# Patient Record
Sex: Male | Born: 1960 | Race: White | Hispanic: No | Marital: Married | Smoking: Never smoker
Health system: Southern US, Community
[De-identification: ages and names within clinical notes are randomized; demographics above are authoritative.]

## PROBLEM LIST (undated history)

## (undated) DIAGNOSIS — K219 Gastro-esophageal reflux disease without esophagitis: Secondary | ICD-10-CM

## (undated) DIAGNOSIS — C61 Malignant neoplasm of prostate: Secondary | ICD-10-CM

## (undated) DIAGNOSIS — F419 Anxiety disorder, unspecified: Secondary | ICD-10-CM

## (undated) DIAGNOSIS — J45909 Unspecified asthma, uncomplicated: Secondary | ICD-10-CM

## (undated) DIAGNOSIS — M2550 Pain in unspecified joint: Secondary | ICD-10-CM

## (undated) DIAGNOSIS — L299 Pruritus, unspecified: Secondary | ICD-10-CM

## (undated) DIAGNOSIS — R12 Heartburn: Secondary | ICD-10-CM

## (undated) DIAGNOSIS — E119 Type 2 diabetes mellitus without complications: Secondary | ICD-10-CM

## (undated) DIAGNOSIS — N138 Other obstructive and reflux uropathy: Secondary | ICD-10-CM

## (undated) DIAGNOSIS — J309 Allergic rhinitis, unspecified: Secondary | ICD-10-CM

## (undated) DIAGNOSIS — M549 Dorsalgia, unspecified: Secondary | ICD-10-CM

## (undated) DIAGNOSIS — G4730 Sleep apnea, unspecified: Secondary | ICD-10-CM

## (undated) DIAGNOSIS — N401 Enlarged prostate with lower urinary tract symptoms: Secondary | ICD-10-CM

## (undated) DIAGNOSIS — R631 Polydipsia: Secondary | ICD-10-CM

## (undated) HISTORY — PX: TOOTH EXTRACTION: SUR596

## (undated) HISTORY — PX: TONSILLECTOMY: SUR1361

---

## 2011-10-04 ENCOUNTER — Ambulatory Visit: Payer: Self-pay | Admitting: Gastroenterology

## 2011-10-07 LAB — PATHOLOGY REPORT

## 2013-12-28 DIAGNOSIS — C61 Malignant neoplasm of prostate: Secondary | ICD-10-CM | POA: Insufficient documentation

## 2013-12-28 HISTORY — DX: Malignant neoplasm of prostate: C61

## 2013-12-28 HISTORY — PX: PROSTATE BIOPSY: SHX241

## 2014-01-25 ENCOUNTER — Ambulatory Visit: Payer: Self-pay

## 2014-01-25 ENCOUNTER — Ambulatory Visit: Payer: Self-pay | Admitting: Radiation Oncology

## 2014-02-01 ENCOUNTER — Encounter: Payer: Self-pay | Admitting: Radiation Oncology

## 2014-02-01 DIAGNOSIS — N138 Other obstructive and reflux uropathy: Secondary | ICD-10-CM | POA: Insufficient documentation

## 2014-02-01 DIAGNOSIS — N401 Enlarged prostate with lower urinary tract symptoms: Secondary | ICD-10-CM

## 2014-02-01 NOTE — Progress Notes (Signed)
GU Location of Tumor / Histology: prostate  If Prostate Cancer, Gleason Score is (3 + 3) and PSA is (6.1 on 10/06/13) 07/2013 PSA 7.1 2013   PSA 3.3 2012   PSA 3.4  Patient presented 3 months ago with signs/symptoms of: urinary frequency, urgency, nocturia, weak stream, stream starts/stops, ED  Biopsies of prostate (if applicable) revealed:  Adenocarcinoma, 4/12 cores, Gleason 3+3=6, volume 33 gm  Past/Anticipated interventions by urology, if any: biopsy1/13/15  Past/Anticipated interventions by medical oncology, if any: none  Weight changes, if any: no  Bowel/Bladder complaints, if any:  Frequency voiding, regular bowel movements  Nausea/Vomiting, if any: no  Pain issues, if any:  no  SAFETY ISSUES:  Prior radiation? no  Pacemaker/ICD? no  Is the patient on methotrexate? 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

## 2014-02-02 ENCOUNTER — Ambulatory Visit
Admission: RE | Admit: 2014-02-02 | Discharge: 2014-02-02 | Disposition: A | Discharge status: Home / Self Care | Payer: BC Managed Care – PPO | Source: Ambulatory Visit | Attending: Radiation Oncology | Admitting: Radiation Oncology

## 2014-02-02 ENCOUNTER — Encounter: Payer: Self-pay | Admitting: Radiation Oncology

## 2014-02-02 VITALS — BP 126/78 | HR 60 | Temp 98.7°F | Resp 20 | Ht 69.0 in | Wt 251.9 lb

## 2014-02-02 DIAGNOSIS — C61 Malignant neoplasm of prostate: Secondary | ICD-10-CM

## 2014-02-02 DIAGNOSIS — Z79899 Other long term (current) drug therapy: Secondary | ICD-10-CM | POA: Insufficient documentation

## 2014-02-02 DIAGNOSIS — Z8042 Family history of malignant neoplasm of prostate: Secondary | ICD-10-CM | POA: Insufficient documentation

## 2014-02-02 HISTORY — DX: Polydipsia: R63.1

## 2014-02-02 HISTORY — DX: Dorsalgia, unspecified: M54.9

## 2014-02-02 HISTORY — DX: Pain in unspecified joint: M25.50

## 2014-02-02 HISTORY — DX: Benign prostatic hyperplasia with lower urinary tract symptoms: N40.1

## 2014-02-02 HISTORY — DX: Anxiety disorder, unspecified: F41.9

## 2014-02-02 HISTORY — DX: Pruritus, unspecified: L29.9

## 2014-02-02 HISTORY — DX: Malignant neoplasm of prostate: C61

## 2014-02-02 HISTORY — DX: Benign prostatic hyperplasia with lower urinary tract symptoms: N13.8

## 2014-02-02 HISTORY — DX: Heartburn: R12

## 2014-02-02 NOTE — Progress Notes (Signed)
Please see the Nurse Progress Note in the MD Initial Consult Encounter for this patient. 

## 2014-02-02 NOTE — Progress Notes (Signed)
[facility] NEW PATIENT EVALUATION  Name: Charles Buchanan MRN: [id number]  Date:   02/02/2014           DOB: 09-28-1961  Status: outpatient   CC: Dr. 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 male who is seen today through the courtesy of Dr. Risa Grill for discussion of possible radiation therapy in the management of his stage T1c favorable risk adenocarcinoma prostate. A screening PSA in 2012 was 3.4. His PSA was 3.3 and 2013, but rose to 7.1 by August of 2014. He was referred to Dr. Risa Grill who performed ultrasound-guided biopsies on 12/28/2013. His was found to have Gleason 6 (3+3) involving 10% of one core from left lateral mid gland, 5% of one core from the left mid gland, 30% of one core from left lateral apex and 60% of one core from the left apex. There was high grade PIN from the lateral mid gland. His gland volume was approximately 33 cc. He does have moderate obstructive symptomatology with an I PSS score of 18 while on tamsulosin. He does have erectile dysfunction. No significant GI difficulties.  PREVIOUS RADIATION THERAPY: No   PAST MEDICAL HISTORY:  has a past medical history of Anxiety; Heartburn; Pruritus; Polydipsia; Back pain; Joint pain; BPH with urinary obstruction; and Prostate cancer (12/28/13).     PAST SURGICAL HISTORY:  Past Surgical History  Procedure Laterality Date  . Prostate biopsy  12/28/13    Gleason 6, vol 33 gm  . Tooth extraction       FAMILY HISTORY: family history includes Cancer in his father, maternal grandfather, and paternal grandfather; Diabetes in his father and paternal grandfather. His grandfather was diagnosed with prostate cancer in his 5s and apparently underwent surgery. His father was diagnosed with prostate cancer at age 50 and underwent  surgery.   SOCIAL HISTORY:  reports that he has never smoked. He does not have any smokeless tobacco history on file. He reports that he does not drink alcohol or use illicit drugs. Married, 3 children. He works as a Animal nutritionist at an Journalist, newspaper.   ALLERGIES: Review of patient's allergies indicates no known allergies.   MEDICATIONS:  Current Outpatient Prescriptions  Medication Sig Dispense Refill  . Cetirizine HCl 10 MG CAPS Take 10 mg by mouth daily.       Marland Kitchen ibuprofen (ADVIL,MOTRIN) 200 MG tablet Take 400 mg by mouth every 6 (six) hours as needed.      Marland Kitchen omeprazole (PRILOSEC) 20 MG capsule Take 20 mg by mouth daily.      . tamsulosin (FLOMAX) 0.4 MG CAPS capsule Take 0.4 mg by mouth daily.       No current facility-administered medications for this encounter.     REVIEW OF SYSTEMS:  Pertinent items are noted in HPI.    PHYSICAL EXAM:  height is 5\' 9"  (1.753 m) and weight is 251 lb 14.4 oz (114.261 kg). His oral temperature is 98.7 F (37.1 C). His blood pressure is 126/78 and his pulse is 60. His respiration is 20.   Alert and oriented. Head and neck examination: Grossly unremarkable. Nodes: Without palpable cervical or supraclavicular adenopathy. Chest: Lungs clear. Back: Without spinal or CVA tenderness. Heart: Regular rate rhythm. Abdomen: Without masses organomegaly. Genitalia: Unremarkable to inspection. Rectal: Prostate gland is  normal size and is without focal induration or nodularity. Extremities: There is an erythematous lesion measuring approximately 2 cm along his medial right thigh apparently starting out as folliculitis according to the patient.   LABORATORY DATA:  No results found for this basename: WBC, HGB, HCT, MCV, PLT   No results found for this basename: NA, K, CL, CO2   No results found for this basename: ALT, AST, GGT, ALKPHOS, BILITOT   PSA 7.1 from August 2014 and 6.1 from 10/06/2013   IMPRESSION: Stage T1c  favorable risk adenocarcinoma  prostate. I explained to the patient and his wife that his prognosis is related to his stage, PSA level, and Gleason score. All are favorable. We discussed surgery versus surveillance/observation, and radiation therapy. Considering his young age he is not a good candidate for surveillance/observation. Radiation therapy options include seed implantation alone or 8 weeks of external beam/IMRT. He is not an ideal candidate for seed implantation based on his urinary obstructive symptomatology. My preference is to have an I PSS score of 15 or less for seed implantation.. His preferred radiation therapy option would be external beam/IMRT. Considering his young age and degree of obstructive symptomatology,  I favor surgery with Dr. Risa Grill. The patient is interested in pursuing robotic surgery with Dr. Risa Grill.   PLAN: As discussed above.  I spent 60 minutes minutes face to face with the patient and more than 50% of that time was spent in counseling and/or coordination of care.

## 2014-02-03 ENCOUNTER — Encounter: Payer: Self-pay | Admitting: *Deleted

## 2014-02-03 NOTE — Progress Notes (Signed)
[facility] Psychosocial Distress Screening Clinical Social Work  Clinical Social Work was referred by distress screening protocol.  The patient scored a 5 on the Psychosocial Distress Thermometer which indicates moderate distress. Clinical Social Worker phoned Pt and left vm on his cell as directed on Distress Screen in attempt to assess for distress and other psychosocial needs.  He checked his main stressor is his prostate cancer.  Clinical Social Worker follow up needed: yes  If yes, follow up plan: CSW awaits return call from pt.  Loren Racer, LCSW Clinical Social Worker [facility] Wednesday, Thursday and Friday Phone: [phone] Fax: [phone]

## 2014-02-07 ENCOUNTER — Other Ambulatory Visit: Payer: Self-pay | Admitting: Urology

## 2014-02-08 ENCOUNTER — Other Ambulatory Visit: Payer: Self-pay | Admitting: Urology

## 2014-02-17 ENCOUNTER — Other Ambulatory Visit (HOSPITAL_COMMUNITY): Payer: Self-pay | Admitting: Urology

## 2014-02-17 ENCOUNTER — Encounter (HOSPITAL_COMMUNITY): Payer: Self-pay | Admitting: Pharmacy Technician

## 2014-02-18 ENCOUNTER — Encounter (HOSPITAL_COMMUNITY)
Admission: RE | Admit: 2014-02-18 | Discharge: 2014-02-18 | Disposition: A | Discharge status: Home / Self Care | Payer: BC Managed Care – PPO | Source: Ambulatory Visit | Attending: Urology | Admitting: Urology

## 2014-02-18 ENCOUNTER — Encounter (HOSPITAL_COMMUNITY): Payer: Self-pay

## 2014-02-18 DIAGNOSIS — Z01812 Encounter for preprocedural laboratory examination: Secondary | ICD-10-CM | POA: Insufficient documentation

## 2014-02-18 HISTORY — DX: Gastro-esophageal reflux disease without esophagitis: K21.9

## 2014-02-18 LAB — BASIC METABOLIC PANEL
BUN: 10 mg/dL (ref 6–23)
CO2: 23 mEq/L (ref 19–32)
Calcium: 9 mg/dL (ref 8.4–10.5)
Chloride: 102 mEq/L (ref 96–112)
Creatinine, Ser: 0.82 mg/dL (ref 0.50–1.35)
GFR calc Af Amer: >90 mL/min (ref >90)
GFR calc non Af Amer: >90 mL/min (ref >90)
GLUCOSE: 107 mg/dL — AB (ref 70–99)
POTASSIUM: 4 meq/L (ref 3.7–5.3)
Sodium: 137 mEq/L (ref 137–147)

## 2014-02-18 LAB — CBC
HCT: 40.3 % (ref 39.0–52.0)
Hemoglobin: 13.2 g/dL (ref 13.0–17.0)
MCH: 26.1 pg (ref 26.0–34.0)
MCHC: 32.8 g/dL (ref 30.0–36.0)
MCV: 79.8 fL (ref 78.0–100.0)
Platelets: 265 10*3/uL (ref 150–400)
RBC: 5.05 MIL/uL (ref 4.22–5.81)
RDW: 14.9 % (ref 11.5–15.5)
WBC: 6.9 10*3/uL (ref 4.0–10.5)

## 2014-02-18 LAB — ABO/RH: ABO/RH(D): B POS

## 2014-02-18 NOTE — Patient Instructions (Addendum)
      Your procedure is scheduled on:  02/23/14  [facility]  Report to [place] at  [place]     AM.  Call this number if you have problems the morning of surgery: [phone]      BOWEL PREP AS PER OFFICE  Do not eat food After Midnight. Monday NIGHT. BEGIN CLEAR LIQUIDS TUESDAY-- DRINK FLUIDS UNTIL MIDNIGHT OR BEDTIME Tuesday NIGHT-- THEN NOTHING BY MOUTH   Take these medicines the morning of surgery with A SIP OF WATER: Ceritizine, Prolisec   .  Contacts, dentures or partial plates, or metal hairpins  can not be worn to surgery. Your family will be responsible for glasses, dentures, hearing aides while you are in surgery  Leave suitcase in the car. After surgery it may be brought to your room.  For patients admitted to the hospital, checkout time is 11:00 AM day of  discharge.                DO NOT WEAR JEWELRY, LOTIONS, POWDERS, OR PERFUMES.  WOMEN-- DO NOT SHAVE LEGS OR UNDERARMS FOR 48 HOURS BEFORE SHOWERS. MEN MAY SHAVE FACE.  Patients discharged the day of surgery will not be allowed to drive home. IF going home the day of surgery, you must have a driver and someone to stay with you for the first 24 hours  Name and phone number of your driver:  admission                                                                      Please read over the following fact sheets that you were given:  Blood Transfusion Sheet  Information                     [place]                                                  Patient Signature _____________________________

## 2014-02-22 NOTE — Anesthesia Preprocedure Evaluation (Addendum)
Anesthesia Evaluation  Patient identified by MRN, date of birth, ID band Patient awake    Reviewed: Allergy & Precautions, H&P , NPO status , Patient's Chart, lab work & pertinent test results  Airway Mallampati: III TM Distance: >3 FB Neck ROM: Full    Dental  (+) Teeth Intact, Dental Advisory Given   Pulmonary neg pulmonary ROS,  breath sounds clear to auscultation  Pulmonary exam normal       Cardiovascular negative cardio ROS  Rhythm:Regular Rate:Normal     Neuro/Psych Anxiety negative neurological ROS  negative psych ROS   GI/Hepatic Neg liver ROS, GERD-  Medicated,  Endo/Other  negative endocrine ROSMorbid obesity  Renal/GU negative Renal ROS  negative genitourinary   Musculoskeletal negative musculoskeletal ROS (+)   Abdominal   Peds  Hematology negative hematology ROS (+)   Anesthesia Other Findings   Reproductive/Obstetrics                          Anesthesia Physical Anesthesia Plan  ASA: II  Anesthesia Plan: General   Post-op Pain Management:    Induction: Intravenous  Airway Management Planned: Oral ETT  Additional Equipment:   Intra-op Plan:   Post-operative Plan: Extubation in OR  Informed Consent: I have reviewed the patients History and Physical, chart, labs and discussed the procedure including the risks, benefits and alternatives for the proposed anesthesia with the patient or authorized representative who has indicated his/her understanding and acceptance.   Dental advisory given  Plan Discussed with: CRNA  Anesthesia Plan Comments:         Anesthesia Quick Evaluation

## 2014-02-22 NOTE — H&P (Signed)
Reason For Visit   Here for RLRRP for T1 C low risk prostate cancer. History of Present Illness    Charles Buchanan presents status post prostate ultrasound and biopsy ( 1/ 2015). He was noted to have an elevated PSA in 6-7 range and had a strong family history of prostate cancer. Digital rectal exam was unremarkable. Ultrasound revealed a 33 g prostate without any worrisome features. All right-sided biopsies were negative with the exception of one biopsy which demonstrated high grade PIN. On the left 4 of 6 biopsies positive. Tumor appeared to be primarily at the left apex. All biopsies showed Gleason 3+3 = 6 cancer with 5-60% core involvement. The patient is best classified as having low risk/favorable clinical stage TIc disease.   He has had no problems with biopsy. No prior intra-abdominal surgery.     ipss: 16/3         SHIM: 17/25   Past Medical History Problems  1. History of Anxiety (300.00) 2. History of heartburn (V12.79)  Surgical History Problems  1. History of Oral Surgery Tooth Extraction  Current Meds 1. Cetirizine HCl - 10 MG Oral Tablet;  Therapy: (Recorded:11Nov2014) to Recorded 2. Omeprazole 20 MG Oral Capsule Delayed Release;  Therapy: (Recorded:11Nov2014) to Recorded 3. Tamsulosin HCl - 0.4 MG Oral Capsule; TAKE 1 CAPSULE EVERY DAY;  Therapy: [id number] to (Last Rx:11Nov2014) Ordered  Allergies Medication  1. No Known Drug Allergies  Family History Problems  1. Family history of diabetes mellitus (V18.0) : Father, Paternal Grandfather 2. Family history of prostate cancer (K59.93) : Father, Paternal Grandfather 3. FHx: throat cancer (V16.0) : Maternal Grandfather  Social History Problems  1. Denied: History of Alcohol use 2. Caffeine use (V49.89)   1 qd 3. Father's age   31yrs 4. Married 5. Mother's age   71yrs 6. Never smoker (V49.89) 7. Occupation   Corporate treasurer 8. Three children  Review of Systems Genitourinary, constitutional, skin, eye,  otolaryngeal, hematologic/lymphatic, cardiovascular, pulmonary, endocrine, musculoskeletal, gastrointestinal, neurological and psychiatric system(s) were reviewed and pertinent findings if present are noted.  Genitourinary: urinary frequency, feelings of urinary urgency, nocturia, weak urinary stream, urinary stream starts and stops and erectile dysfunction.  Constitutional: feeling tired (fatigue).  Integumentary: skin rash/lesion and pruritus.  ENT: sinus problems.  Endocrine: polydipsia.  Musculoskeletal: back pain and joint pain.  Psychiatric: anxiety.    Vitals Vital Signs [Data Includes: Last 1 Day]   Blood Pressure: 130 / 82 Temperature: 98.5 F Heart Rate: 74   Physical Exam Constitutional: Well nourished and well developed . No acute distress.  ENT:. The ears and nose are normal in appearance.  Neck: The appearance of the neck is normal and no neck mass is present.  Pulmonary: No respiratory distress and normal respiratory rhythm and effort.  Cardiovascular: Heart rate and rhythm are normal . No peripheral edema.  Abdomen: The abdomen is soft and nontender. No masses are palpated. No CVA tenderness. No hernias are palpable. No hepatosplenomegaly noted.  Rectal: Rectal exam demonstrates normal sphincter tone, no tenderness and no masses. Estimated prostate size is 1+. Normal rectal tone, no rectal masses, prostate is smooth, symmetric and non-tender. The prostate has no nodularity and is not tender. The left seminal vesicle is nonpalpable. The right seminal vesicle is nonpalpable. The perineum is normal on inspection.  Genitourinary: Examination of the penis demonstrates no discharge, no masses, no lesions and a normal meatus. The penis is uncircumcised. The scrotum is without lesions. The right epididymis is palpably normal and non-tender. The  left epididymis is palpably normal and non-tender. The right testis is non-tender and without masses. The left testis is non-tender and without  masses.  Lymphatics: The femoral and inguinal nodes are not enlarged or tender.  Skin: Normal skin turgor, no visible rash and no visible skin lesions.  Neuro/Psych:. Mood and affect are appropriate.   Assessment Assessed  1. Prostate cancer (185)  Plan Prostate cancer  1. Radiation Oncology Referral Referral  Referral dr. Valere Dross possible seeds  Status: Hold  For - Appointment,Records  Requested for: 02Feb2015  Discussion/Summary  The patient was counseled about the natural history of prostate cancer and the standard treatment options that are available for prostate cancer. It was explained to him how his age and life expectancy, clinical stage, Gleason score, and PSA affect his prognosis, the decision to proceed with additional staging studies, as well as how that information influences recommended treatment strategies. We discussed the roles for active surveillance, radiation therapy, surgical therapy, androgen deprivation, as well as ablative therapy options for the treatment of prostate cancer as appropriate to his individual cancer situation. We discussed the risks and benefits of these options with regard to their impact on cancer control and also in terms of potential adverse events, complications, and impact on quiality of life particularly related to urinary, bowel, and sexual function. The patient was encouraged to ask questions throughout the discussion today and all questions were answered to his stated satisfaction. In addition, the patient was provided with and/or directed to appropriate resources and literature for further education about prostate cancer and treatment options.   We discussed surgical therapy for prostate cancer including the different available surgical approaches. We discussed, in detail, the risks and expectations of surgery with regard to cancer control, urinary control, and erectile function as well as the expected postoperative recovery process. The risks,  potential complications/adverse events of radical prostatectomy as well as alternative options were explained to the patient.   We discussed surgical therapy for prostate cancer including the different available surgical approaches. We discussed, in detail, the risks and expectations of surgery with regard to cancer control, urinary control, and erectile function as well as the expected postoperative recovery process. Additional risks of surgery including but not limited to bleeding, infection, hernia formation, nerve damage, lymphocele formation, bowel/rectal injury potentially necessitating colostomy, damage to the urinary tract resulting in urine leakage, urethral stricture, and the cardiopulmonary risks such as myocardial infarction, stroke, death, venothromboembolism, etc. were explained. The risk of open surgical conversion for robotic/laparoscopic prostatectomy was also discussed.   45 minutes were spent in face to face consultation with patient today.

## 2014-02-23 ENCOUNTER — Ambulatory Visit (HOSPITAL_COMMUNITY): Payer: BC Managed Care – PPO | Admitting: Anesthesiology

## 2014-02-23 ENCOUNTER — Inpatient Hospital Stay (HOSPITAL_COMMUNITY)
Admission: RE | Admit: 2014-02-23 | Discharge: 2014-02-24 | DRG: 708 | Disposition: A | Discharge status: Home / Self Care | Payer: BC Managed Care – PPO | Source: Ambulatory Visit | Attending: Urology | Admitting: Urology

## 2014-02-23 ENCOUNTER — Encounter (HOSPITAL_COMMUNITY): Payer: BC Managed Care – PPO | Admitting: Anesthesiology

## 2014-02-23 ENCOUNTER — Encounter (HOSPITAL_COMMUNITY): Payer: Self-pay

## 2014-02-23 ENCOUNTER — Encounter (HOSPITAL_COMMUNITY)
Admission: RE | Disposition: A | Discharge status: Home / Self Care | Payer: Self-pay | Source: Ambulatory Visit | Attending: Urology

## 2014-02-23 DIAGNOSIS — Z6836 Body mass index (BMI) 36.0-36.9, adult: Secondary | ICD-10-CM

## 2014-02-23 DIAGNOSIS — Z8042 Family history of malignant neoplasm of prostate: Secondary | ICD-10-CM

## 2014-02-23 DIAGNOSIS — C61 Malignant neoplasm of prostate: Principal | ICD-10-CM | POA: Diagnosis present

## 2014-02-23 DIAGNOSIS — Z01812 Encounter for preprocedural laboratory examination: Secondary | ICD-10-CM

## 2014-02-23 DIAGNOSIS — Z79899 Other long term (current) drug therapy: Secondary | ICD-10-CM

## 2014-02-23 DIAGNOSIS — F411 Generalized anxiety disorder: Secondary | ICD-10-CM | POA: Diagnosis present

## 2014-02-23 DIAGNOSIS — R972 Elevated prostate specific antigen [PSA]: Secondary | ICD-10-CM | POA: Diagnosis present

## 2014-02-23 HISTORY — PX: ROBOT ASSISTED LAPAROSCOPIC RADICAL PROSTATECTOMY: SHX5141

## 2014-02-23 LAB — TYPE AND SCREEN
ABO/RH(D): B POS
Antibody Screen: NEGATIVE

## 2014-02-23 LAB — HEMOGLOBIN AND HEMATOCRIT, BLOOD
HEMATOCRIT: 38.1 % — AB (ref 39.0–52.0)
HEMOGLOBIN: 12.4 g/dL — AB (ref 13.0–17.0)

## 2014-02-23 SURGERY — ROBOTIC ASSISTED LAPAROSCOPIC RADICAL PROSTATECTOMY
Anesthesia: General

## 2014-02-23 MED ORDER — PROPOFOL 10 MG/ML IV BOLUS
INTRAVENOUS | Status: DC | PRN
  Administered 2014-02-23: 200 mg via INTRAVENOUS

## 2014-02-23 MED ORDER — BUPIVACAINE-EPINEPHRINE PF 0.25-1:200000 % IJ SOLN
INTRAMUSCULAR | Status: AC
  Filled 2014-02-23: qty 30

## 2014-02-23 MED ORDER — DEXAMETHASONE SODIUM PHOSPHATE 10 MG/ML IJ SOLN
INTRAMUSCULAR | Status: DC | PRN
  Administered 2014-02-23: 10 mg via INTRAVENOUS

## 2014-02-23 MED ORDER — FENTANYL CITRATE 0.05 MG/ML IJ SOLN
INTRAMUSCULAR | Status: DC | PRN
  Administered 2014-02-23 (×7): 50 ug via INTRAVENOUS

## 2014-02-23 MED ORDER — BUPIVACAINE-EPINEPHRINE 0.25% -1:200000 IJ SOLN
INTRAMUSCULAR | Status: DC | PRN
  Administered 2014-02-23: 30 mL

## 2014-02-23 MED ORDER — CIPROFLOXACIN HCL 500 MG PO TABS: 500.0000 mg | ORAL_TABLET | Freq: Two times a day (BID) | ORAL | Status: DC

## 2014-02-23 MED ORDER — SODIUM CHLORIDE 0.9 % IR SOLN
Status: DC | PRN
  Administered 2014-02-23: 1000 mL

## 2014-02-23 MED ORDER — PHENYLEPHRINE 40 MCG/ML (10ML) SYRINGE FOR IV PUSH (FOR BLOOD PRESSURE SUPPORT)
PREFILLED_SYRINGE | INTRAVENOUS | Status: AC
  Filled 2014-02-23: qty 10

## 2014-02-23 MED ORDER — HYDROMORPHONE HCL PF 1 MG/ML IJ SOLN
0.2500 mg | INTRAMUSCULAR | Status: DC | PRN
  Administered 2014-02-23: 0.5 mg via INTRAVENOUS
  Administered 2014-02-23 (×2): 0.25 mg via INTRAVENOUS
  Administered 2014-02-23: 0.5 mg via INTRAVENOUS

## 2014-02-23 MED ORDER — NEOSTIGMINE METHYLSULFATE 1 MG/ML IJ SOLN
INTRAMUSCULAR | Status: AC
  Filled 2014-02-23: qty 10

## 2014-02-23 MED ORDER — CEFAZOLIN SODIUM-DEXTROSE 2-3 GM-% IV SOLR
2.0000 g | INTRAVENOUS | Status: AC
  Administered 2014-02-23: 2 g via INTRAVENOUS

## 2014-02-23 MED ORDER — LACTATED RINGERS IV SOLN
INTRAVENOUS | Status: DC
  Administered 2014-02-23: 1000 mL via INTRAVENOUS

## 2014-02-23 MED ORDER — MORPHINE SULFATE 2 MG/ML IJ SOLN
2.0000 mg | INTRAMUSCULAR | Status: DC | PRN
  Administered 2014-02-23: 4 mg via INTRAVENOUS
  Administered 2014-02-24: 2 mg via INTRAVENOUS
  Filled 2014-02-23: qty 1
  Filled 2014-02-23: qty 2

## 2014-02-23 MED ORDER — CEFAZOLIN SODIUM-DEXTROSE 2-3 GM-% IV SOLR
INTRAVENOUS | Status: AC
  Filled 2014-02-23: qty 50

## 2014-02-23 MED ORDER — PHENYLEPHRINE HCL 10 MG/ML IJ SOLN
INTRAMUSCULAR | Status: DC | PRN
  Administered 2014-02-23: 80 ug via INTRAVENOUS

## 2014-02-23 MED ORDER — LIDOCAINE HCL (CARDIAC) 20 MG/ML IV SOLN
INTRAVENOUS | Status: DC | PRN
  Administered 2014-02-23: 100 mg via INTRAVENOUS

## 2014-02-23 MED ORDER — PROMETHAZINE HCL 25 MG/ML IJ SOLN: 6.2500 mg | INTRAMUSCULAR | Status: DC | PRN

## 2014-02-23 MED ORDER — KETOROLAC TROMETHAMINE 30 MG/ML IJ SOLN
30.0000 mg | Freq: Four times a day (QID) | INTRAMUSCULAR | Status: DC
  Administered 2014-02-23 – 2014-02-24 (×4): 30 mg via INTRAVENOUS
  Filled 2014-02-23 (×5): qty 1

## 2014-02-23 MED ORDER — MIDAZOLAM HCL 5 MG/5ML IJ SOLN
INTRAMUSCULAR | Status: DC | PRN
  Administered 2014-02-23: 2 mg via INTRAVENOUS

## 2014-02-23 MED ORDER — MEPERIDINE HCL 50 MG/ML IJ SOLN
INTRAMUSCULAR | Status: AC
  Filled 2014-02-23: qty 1

## 2014-02-23 MED ORDER — GLYCOPYRROLATE 0.2 MG/ML IJ SOLN
INTRAMUSCULAR | Status: AC
  Filled 2014-02-23: qty 3

## 2014-02-23 MED ORDER — HYDROCODONE-ACETAMINOPHEN 5-325 MG PO TABS
1.0000 | ORAL_TABLET | ORAL | Status: DC | PRN
Start: 2014-02-23 — End: 2014-02-24
  Administered 2014-02-24 (×2): 2 via ORAL
  Filled 2014-02-23 (×2): qty 2

## 2014-02-23 MED ORDER — LACTATED RINGERS IV SOLN
INTRAVENOUS | Status: DC | PRN
  Administered 2014-02-23: 09:00:00

## 2014-02-23 MED ORDER — ROCURONIUM BROMIDE 100 MG/10ML IV SOLN
INTRAVENOUS | Status: AC
  Filled 2014-02-23: qty 1

## 2014-02-23 MED ORDER — GLYCOPYRROLATE 0.2 MG/ML IJ SOLN
INTRAMUSCULAR | Status: DC | PRN
  Administered 2014-02-23: 0.6 mg via INTRAVENOUS

## 2014-02-23 MED ORDER — SODIUM CHLORIDE 0.9 % IV BOLUS (SEPSIS)
1000.0000 mL | Freq: Once | INTRAVENOUS | Status: AC
  Administered 2014-02-23: 1000 mL via INTRAVENOUS

## 2014-02-23 MED ORDER — LIDOCAINE HCL (CARDIAC) 20 MG/ML IV SOLN
INTRAVENOUS | Status: AC
  Filled 2014-02-23: qty 5

## 2014-02-23 MED ORDER — ROCURONIUM BROMIDE 100 MG/10ML IV SOLN
INTRAVENOUS | Status: DC | PRN
  Administered 2014-02-23 (×2): 10 mg via INTRAVENOUS
  Administered 2014-02-23: 50 mg via INTRAVENOUS
  Administered 2014-02-23: 10 mg via INTRAVENOUS

## 2014-02-23 MED ORDER — LACTATED RINGERS IV SOLN
INTRAVENOUS | Status: DC | PRN
  Administered 2014-02-23 (×2): via INTRAVENOUS

## 2014-02-23 MED ORDER — SUCCINYLCHOLINE CHLORIDE 20 MG/ML IJ SOLN
INTRAMUSCULAR | Status: DC | PRN
  Administered 2014-02-23: 100 mg via INTRAVENOUS

## 2014-02-23 MED ORDER — PROPOFOL 10 MG/ML IV BOLUS
INTRAVENOUS | Status: AC
  Filled 2014-02-23: qty 20

## 2014-02-23 MED ORDER — HEPARIN SODIUM (PORCINE) 1000 UNIT/ML IJ SOLN
INTRAMUSCULAR | Status: AC
  Filled 2014-02-23: qty 1

## 2014-02-23 MED ORDER — ACETAMINOPHEN 325 MG PO TABS: 650.0000 mg | ORAL_TABLET | ORAL | Status: DC | PRN

## 2014-02-23 MED ORDER — FENTANYL CITRATE 0.05 MG/ML IJ SOLN
INTRAMUSCULAR | Status: AC
  Filled 2014-02-23: qty 5

## 2014-02-23 MED ORDER — HYDROCODONE-ACETAMINOPHEN 5-325 MG PO TABS: 1.0000 | ORAL_TABLET | Freq: Four times a day (QID) | ORAL | Status: DC | PRN

## 2014-02-23 MED ORDER — DEXAMETHASONE SODIUM PHOSPHATE 10 MG/ML IJ SOLN
INTRAMUSCULAR | Status: AC
  Filled 2014-02-23: qty 1

## 2014-02-23 MED ORDER — FENTANYL CITRATE 0.05 MG/ML IJ SOLN
INTRAMUSCULAR | Status: AC
  Filled 2014-02-23: qty 2

## 2014-02-23 MED ORDER — NEOSTIGMINE METHYLSULFATE 1 MG/ML IJ SOLN
INTRAMUSCULAR | Status: DC | PRN
  Administered 2014-02-23: 5 mg via INTRAVENOUS

## 2014-02-23 MED ORDER — DEXTROSE-NACL 5-0.45 % IV SOLN
INTRAVENOUS | Status: DC
  Administered 2014-02-23: 23:00:00 via INTRAVENOUS
  Administered 2014-02-23: 125 mL/h via INTRAVENOUS

## 2014-02-23 MED ORDER — MIDAZOLAM HCL 2 MG/2ML IJ SOLN
INTRAMUSCULAR | Status: AC
  Filled 2014-02-23: qty 2

## 2014-02-23 MED ORDER — HYDROMORPHONE HCL PF 1 MG/ML IJ SOLN
INTRAMUSCULAR | Status: AC
  Filled 2014-02-23: qty 1

## 2014-02-23 MED ORDER — KETOROLAC TROMETHAMINE 30 MG/ML IJ SOLN
INTRAMUSCULAR | Status: AC
  Filled 2014-02-23: qty 1

## 2014-02-23 MED ORDER — MEPERIDINE HCL 50 MG/ML IJ SOLN
6.2500 mg | INTRAMUSCULAR | Status: DC | PRN
  Administered 2014-02-23: 6.25 mg via INTRAVENOUS

## 2014-02-23 MED ORDER — PANTOPRAZOLE SODIUM 40 MG PO TBEC
40.0000 mg | DELAYED_RELEASE_TABLET | Freq: Every day | ORAL | Status: DC
  Administered 2014-02-24: 40 mg via ORAL
  Filled 2014-02-23 (×2): qty 1

## 2014-02-23 MED ORDER — ONDANSETRON HCL 4 MG/2ML IJ SOLN
INTRAMUSCULAR | Status: DC | PRN
  Administered 2014-02-23: 4 mg via INTRAVENOUS

## 2014-02-23 MED ORDER — ONDANSETRON HCL 4 MG/2ML IJ SOLN
INTRAMUSCULAR | Status: AC
  Filled 2014-02-23: qty 2

## 2014-02-23 MED ORDER — STERILE WATER FOR IRRIGATION IR SOLN
Status: DC | PRN
  Administered 2014-02-23: 3000 mL

## 2014-02-23 SURGICAL SUPPLY — 48 items
APPLICATOR SURGIFLO ENDO (HEMOSTASIS) ×2 IMPLANT
CABLE HIGH FREQUENCY MONO STRZ (ELECTRODE) ×2 IMPLANT
CATH FOLEY 2WAY SLVR 18FR 30CC (CATHETERS) ×2 IMPLANT
CATH ROBINSON RED A/P 16FR (CATHETERS) ×2 IMPLANT
CATH TIEMANN FOLEY 18FR 5CC (CATHETERS) ×2 IMPLANT
CHLORAPREP W/TINT 26ML (MISCELLANEOUS) ×2 IMPLANT
CLIP LIGATING HEM O LOK PURPLE (MISCELLANEOUS) ×2 IMPLANT
CLOTH BEACON ORANGE TIMEOUT ST (SAFETY) ×2 IMPLANT
COVER SURGICAL LIGHT HANDLE (MISCELLANEOUS) ×2 IMPLANT
COVER TIP SHEARS 8 DVNC (MISCELLANEOUS) ×1 IMPLANT
COVER TIP SHEARS 8MM DA VINCI (MISCELLANEOUS) ×1
CUTTER ECHEON FLEX ENDO 45 340 (ENDOMECHANICALS) ×2 IMPLANT
DECANTER SPIKE VIAL GLASS SM (MISCELLANEOUS) ×2 IMPLANT
DRAPE SURG IRRIG POUCH 19X23 (DRAPES) ×2 IMPLANT
DRSG TEGADERM 2-3/8X2-3/4 SM (GAUZE/BANDAGES/DRESSINGS) ×8 IMPLANT
DRSG TEGADERM 4X4.75 (GAUZE/BANDAGES/DRESSINGS) ×4 IMPLANT
DRSG TEGADERM 6X8 (GAUZE/BANDAGES/DRESSINGS) ×4 IMPLANT
ELECT REM PT RETURN 9FT ADLT (ELECTROSURGICAL) ×2
ELECTRODE REM PT RTRN 9FT ADLT (ELECTROSURGICAL) ×1 IMPLANT
GAUZE SPONGE 2X2 8PLY STRL LF (GAUZE/BANDAGES/DRESSINGS) ×1 IMPLANT
GLOVE BIO SURGEON STRL SZ 6.5 (GLOVE) ×2 IMPLANT
GLOVE BIOGEL M STRL SZ7.5 (GLOVE) ×4 IMPLANT
GOWN STRL REUS W/TWL LRG LVL3 (GOWN DISPOSABLE) ×2 IMPLANT
GOWN STRL REUS W/TWL XL LVL3 (GOWN DISPOSABLE) ×4 IMPLANT
HOLDER FOLEY CATH W/STRAP (MISCELLANEOUS) ×2 IMPLANT
IV LACTATED RINGERS 1000ML (IV SOLUTION) IMPLANT
KIT ACCESSORY DA VINCI DISP (KITS) ×1
KIT ACCESSORY DVNC DISP (KITS) ×1 IMPLANT
NDL SAFETY ECLIPSE 18X1.5 (NEEDLE) ×1 IMPLANT
NEEDLE HYPO 18GX1.5 SHARP (NEEDLE) ×1
PACK ROBOT UROLOGY CUSTOM (CUSTOM PROCEDURE TRAY) ×2 IMPLANT
RELOAD GREEN ECHELON 45 (STAPLE) ×2 IMPLANT
SEALER TISSUE G2 CVD JAW 45CM (ENDOMECHANICALS) IMPLANT
SET TUBE IRRIG SUCTION NO TIP (IRRIGATION / IRRIGATOR) ×2 IMPLANT
SOLUTION ELECTROLUBE (MISCELLANEOUS) ×2 IMPLANT
SPONGE GAUZE 2X2 STER 10/PKG (GAUZE/BANDAGES/DRESSINGS) ×1
SURGIFLO W/THROMBIN 8M KIT (HEMOSTASIS) ×2 IMPLANT
SUT ETHILON 3 0 PS 1 (SUTURE) ×4 IMPLANT
SUT VIC AB 2-0 SH 27 (SUTURE)
SUT VIC AB 2-0 SH 27X BRD (SUTURE) IMPLANT
SUT VICRYL 0 UR6 27IN ABS (SUTURE) ×2 IMPLANT
SUT VLOC BARB 180 ABS3/0GR12 (SUTURE) ×6
SUTURE VLOC BRB 180 ABS3/0GR12 (SUTURE) ×3 IMPLANT
SYR 27GX1/2 1ML LL SAFETY (SYRINGE) ×2 IMPLANT
TOWEL OR 17X26 10 PK STRL BLUE (TOWEL DISPOSABLE) ×2 IMPLANT
TOWEL OR NON WOVEN STRL DISP B (DISPOSABLE) ×2 IMPLANT
TROCAR 12M 150ML BLUNT (TROCAR) ×2 IMPLANT
WATER STERILE IRR 1500ML POUR (IV SOLUTION) IMPLANT

## 2014-02-23 NOTE — Transfer of Care (Signed)
Immediate Anesthesia Transfer of Care Note  Patient: Charles Buchanan  Procedure(s) Performed: Procedure(s): ROBOTIC ASSISTED LAPAROSCOPIC RADICAL PROSTATECTOMY (N/A)  Patient Location: PACU  Anesthesia Type:General  Level of Consciousness: sedated  Airway & Oxygen Therapy: Patient Spontanous Breathing and Patient connected to face mask oxygen  Post-op Assessment: Report given to PACU RN and Post -op Vital signs reviewed and stable  Post vital signs: Reviewed and stable  Complications: No apparent anesthesia complications

## 2014-02-23 NOTE — Anesthesia Postprocedure Evaluation (Signed)
Anesthesia Post Note  Patient: Charles Buchanan  Procedure(s) Performed: Procedure(s) (LRB): ROBOTIC ASSISTED LAPAROSCOPIC RADICAL PROSTATECTOMY (N/A)  Anesthesia type: General  Patient location: PACU  Post pain: Pain level controlled  Post assessment: Post-op Vital signs reviewed  Last Vitals:  Filed Vitals:   02/23/14 1310  BP: 128/81  Pulse: 86  Temp: 37.2 C  Resp: 18    Post vital signs: Reviewed  Level of consciousness: sedated  Complications: No apparent anesthesia complications

## 2014-02-23 NOTE — Op Note (Signed)
Preoperative diagnosis: Clinical stage T1c Adenocarcinoma prostate  Postoperative diagnosis: Same  Procedure: Robotic-assisted laparoscopic radical retropubic prostatectomy  Surgeon: Bernestine Amass, MD  Asst.: Leta Baptist, PA Anesthesia: Gen. Endotracheal  Indications: Patient was diagnosed with clinical stage TIc Adenocarcinoma the prostate. He underwent extensive consultation with regard to treatment options. The patient decided on a surgical approach. He appeared to understand the distinct advantages as well as the disadvantages of this procedure. The patient has performed a mechanical bowel prep. He has had placement of PAS compression boots and has received perioperative antibiotics. The patient's preoperative PSA was 6.1. Ultrasound revealed a 33 g prostate.   Technique and findings:The patient was brought to the operating room and had successful induction of general endotracheal anesthesia.the patient was placed in a low lithotomy position with careful padding of all extremities. He was secured to the operative table and placed in the steep Trendelenburg position. He was prepped and draped in usual manner. A Foley catheter was placed sterilely on the field. Camera port site was chosen 18 cm above the pubic symphysis just to the left of the umbilicus. A standard open Hassan technique was utilized. A 12 mm trocar was placed without difficulty. The camera was then inserted and no abnormalities were noted within the pelvis. The trochars were placed with direct visual guidance. This included 3 65mm robotic trochars and a 12 mm and 5 mm assist ports. Once all the ports were placed the robot was docked. The bladder was filled and the space of Retzius was developed with electrocautery dissection as well as blunt dissection. Superficial fat off the endopelvic fascia and bladder neck was removed with electrocautery scissors. The endopelvic fascia was then incised bilaterally from base to apex. Levator  musculature was swept off the apex of the prostate isolating the dorsal venous complex which was then stapled with the ETS stapling device. The anterior bladder neck was identified with the aid of the Foley balloon. This was then transected down to the Foley catheter with electrocautery scissors. The Foley catheter was then retracted anteriorly.  We appeared to be well away from the ureteral orifices. The posterior bladder neck was then transected and the dissection carried down to the adnexal structures. The seminal vesicles and vas deferens on both sides were then individually dissected free and retracted anteriorly. The posterior plane between the rectum and prostate was then established primarily with blunt dissection.  Attention was then turned towards nerve sparing. The patient was felt to be a candidate for bilateral nerve sparing. Superficial fascia along the anterior lateral aspect of the prostate was incised bilaterally. This tissue was then swept laterally until we were able to establish a groove between the neurovascular tissue and the posterior lateral aspect on the prostate bilaterally. This groove was then extended from the apex back to the base of the prostate. With the prostate retracted anteriorly the vascular pedicles of the prostate were taken with hemolock clips. The Foley catheter was then reinserted and the anterior urethra was transected. The posterior urethra was then transected as were some rectourethralis fibers. The prostate was then removed from the pelvis. The pelvis was then copiously irrigated. Rectal insufflation was performed and there was no evidence of rectal injury.   Attention was then turned towards reconstruction. The bladder neck did not require any reconstruction. The bladder neck and posterior urethra were reapproximated at the 6:00 position utilizing a 2-0 Vicryl suture. The rest of the anastomosis was done with a double-armed 3-0 -lockl suture in a  360 degree manner.   A new catheter was placed and bladder irrigation revealed no evidence of leakage. A Blake drain was placed through one of the robotic trochars and positioned in the retropubic space above the anastomosis. This was then secured to the skin with a nylon suture. The prostate was placed in the Endopouch retrieval bag. The 12 mm trocar site was closed with a Vicryl suture with the aid of a suture passer. Our other trochars were taken out with direct visual guidance without evidence of any bleeding. The camera port incision was extended slightly to allow for removal of the specimen and then closed with a running Vicryl suture. All port sites were infiltrated with Marcaine and then closed with surgical clips. The patient was then taken to recovery room having had no obvious complications or problems. Sponge and needle counts were correct.

## 2014-02-23 NOTE — Preoperative (Signed)
Beta Blockers   Reason not to administer Beta Blockers:Not Applicable 

## 2014-02-23 NOTE — Progress Notes (Signed)
Patient ID: Charles Buchanan, male   DOB: [birth year], 53 y.o.   MRN: [id number] Post-op note  Subjective: The patient is doing well.  No complaints. Denies N/V  Objective: Vital signs in last 24 hours: Temp:  [98 F (36.7 C)-99 F (37.2 C)] 99 F (37.2 C) (03/11 1310) Pulse Rate:  [79-92] 86 (03/11 1310) Resp:  [10-18] 18 (03/11 1310) BP: (125-160)/(66-94) 128/81 mmHg (03/11 1310) SpO2:  [91 %-97 %] 95 % (03/11 1310) Weight:  [112.4 kg (247 lb 12.8 oz)] 112.4 kg (247 lb 12.8 oz) (03/11 1310)  Intake/Output from previous day:   Intake/Output this shift: Total I/O In: 1800 [I.V.:1800] Out: 150 [Urine:110; Drains:40]  Physical Exam:  General: Alert and oriented. Abdomen: Soft, Nondistended. Incisions: Clean and dry. Urine: red  Lab Results:  Recent Labs  02/23/14 1220  HGB 12.4*  HCT 38.1*    Assessment/Plan: POD#0   1) Continue to monitor  2) DVT prophy, clears, IS, amb, pain control     LOS: 0 days   YARBROUGH,Jameel Quant G. 02/23/2014, 3:23 PM

## 2014-02-23 NOTE — Interval H&P Note (Signed)
History and Physical Interval Note:  02/23/2014 8:17 AM  Charles Buchanan  has presented today for surgery, with the diagnosis of PROSTATE CANCER  The various methods of treatment have been discussed with the patient and family. After consideration of risks, benefits and other options for treatment, the patient has consented to  Procedure(s): ROBOTIC [place] (N/A) as a surgical intervention .  The patient's history has been reviewed, patient examined, no change in status, stable for surgery.  I have reviewed the patient's chart and labs.  Questions were answered to the patient's satisfaction.     [name]

## 2014-02-23 NOTE — Discharge Instructions (Signed)
1. Activity:  You are encouraged to ambulate frequently (about every hour during waking hours) to help prevent blood clots from forming in your legs or lungs.  However, you should not engage in any heavy lifting (> 10-15 lbs), strenuous activity, or straining. 2. Diet: You should continue a clear liquid diet until passing gas from below.  Once this occurs, you may advance your diet to a soft diet that would be easy to digest (i.e soups, scrambled eggs, mashed potatoes, etc.) for 24 hours just as you would if getting over a bad stomach flu.  If tolerating this diet well for 24 hours, you may then begin eating regular food.  It will be normal to have some amount of bloating, nausea, and abdominal discomfort intermittently. 3. Prescriptions:  You will be provided a prescription for pain medication to take as needed.  If your pain is not severe enough to require the prescription pain medication, you may take extra strength Tylenol instead.  You should also take an over the counter stool softener (Colace 100 mg twice daily) to avoid straining with bowel movements as the pain medication may constipate you. Finally, you will also be provided a prescription for an antibiotic to begin the day prior to your return visit in the office for catheter removal. 4. Catheter care: You will be taught how to take care of the catheter by the nursing staff prior to discharge from the hospital.  You may use both a leg bag and the larger bedside bag but it is recommended to at least use the bigger bedside bag at nighttime as the leg bag is small and will fill up overnight and also does not drain as well when lying flat. You may periodically feel a strong urge to void with the catheter in place.  This is a bladder spasm and most often can occur when having a bowel movement or when you are moving around. It is typically self-limited and usually will stop after a few minutes.  You may use some Vaseline or Neosporin around the tip of the  catheter to reduce friction at the tip of the penis. 5. Incisions: You may remove your dressing bandages the 2nd day after surgery.  You most likely will have a few small staples in each of the incisions and once the bandages are removed, the incisions may stay open to air.  You may start showering (not soaking or bathing in water) 48 hours after surgery and the incisions simply need to be patted dry after the shower.  No additional care is needed. 6. What to call us about: You should call the office [phone]) if you develop fever > 101, persistent vomiting, or the catheter stops draining. Also, feel free to call with any other questions you may have and remember the handout that was provided to you as a reference preoperatively which answers many of the common questions that arise after surgery.  You may resume aspirin, vitamins, and advil 7 days after surgery.

## 2014-02-24 ENCOUNTER — Encounter (HOSPITAL_COMMUNITY): Payer: Self-pay | Admitting: Urology

## 2014-02-24 LAB — BASIC METABOLIC PANEL
BUN: 9 mg/dL (ref 6–23)
CALCIUM: 8.3 mg/dL — AB (ref 8.4–10.5)
CO2: 22 meq/L (ref 19–32)
Chloride: 104 mEq/L (ref 96–112)
Creatinine, Ser: 0.79 mg/dL (ref 0.50–1.35)
GFR calc Af Amer: >90 mL/min (ref >90)
GFR calc non Af Amer: >90 mL/min (ref >90)
GLUCOSE: 149 mg/dL — AB (ref 70–99)
Potassium: 4.3 mEq/L (ref 3.7–5.3)
Sodium: 138 mEq/L (ref 137–147)

## 2014-02-24 LAB — HEMOGLOBIN AND HEMATOCRIT, BLOOD
HEMATOCRIT: 35 % — AB (ref 39.0–52.0)
Hemoglobin: 11 g/dL — ABNORMAL LOW (ref 13.0–17.0)

## 2014-02-24 MED ORDER — OXYBUTYNIN CHLORIDE 5 MG PO TABS: 5.0000 mg | ORAL_TABLET | Freq: Three times a day (TID) | ORAL | Status: AC | PRN

## 2014-02-24 MED ORDER — BISACODYL 10 MG RE SUPP
10.0000 mg | Freq: Once | RECTAL | Status: DC
  Filled 2014-02-24: qty 1

## 2014-02-24 MED ORDER — OXYBUTYNIN CHLORIDE 5 MG PO TABS
5.0000 mg | ORAL_TABLET | Freq: Three times a day (TID) | ORAL | Status: DC | PRN
  Administered 2014-02-24: 5 mg via ORAL
  Filled 2014-02-24: qty 1

## 2014-02-24 NOTE — Progress Notes (Signed)
1 Day Post-Op Subjective: The patient is doing well.  No nausea or vomiting. Pain is adequately controlled. C/o some bladder pressure-urine clear. Low JP output  Objective: Vital signs in last 24 hours: Temp:  [98.2 F (36.8 C)-99 F (37.2 C)] 98.2 F (36.8 C) (03/12 0611) Pulse Rate:  [65-92] 65 (03/12 0611) Resp:  [10-18] 18 (03/12 0611) BP: (103-160)/(56-94) 103/56 mmHg (03/12 0611) SpO2:  [91 %-98 %] 97 % (03/12 0611) Weight:  [247 lb 12.8 oz (112.4 kg)] 247 lb 12.8 oz (112.4 kg) (03/11 1310)  Intake/Output from previous day: 03/11 0701 - 03/12 0700 In: 4269.2 [P.O.:540; I.V.:3729.2] Out: 4010 [Urine:3860; Drains:150] Intake/Output this shift:    Physical Exam:  General: Alert and oriented. CV: RRR Lungs: Normal effort. GI: Soft, Nondistended. Incisions: Dressings intact. Urine: Clear Extremities: Nontender, no erythema, no edema.  Lab Results:  Recent Labs  02/23/14 1220 02/24/14 0357  HGB 12.4* 11.0*  HCT 38.1* 35.0*    Assessment/Plan: POD# 1 s/p robotic prostatectomy.  1) SL IVF 2) Ambulate, Incentive spirometry 3) Transition to oral pain medication 4) Dulcolax suppository 5) D/C pelvic drain 6) Plan for likely discharge later today 7/ may need ditropan for d/c  Bernestine Amass, MD

## 2014-02-24 NOTE — Progress Notes (Signed)
Late Entry: pt discharged from facilty. Directions and questions addressed on foley care and prescriptions. Pt aware that MD will call Cipro to pharmacy. Aware when he needs to take medications and to follow up to MD in 1 week. Wife at bedside. Verbalized understanding. Pt wheeled down to vehicle. No pain at the time of discharge.

## 2014-02-24 NOTE — Discharge Summary (Signed)
  Date of admission: 02/23/2014  Date of discharge: 02/24/2014  Admission diagnosis: Prostate Cancer  Discharge diagnosis: Prostate Cancer  History and Physical: For full details, please see admission history and physical. Briefly, Charles Buchanan is a 53 y.o. gentleman with localized prostate cancer.  After discussing management/treatment options, he elected to proceed with surgical treatment.  Hospital Course: Harith Mccadden was taken to the operating room on 02/23/2014 and underwent a robotic assisted laparoscopic radical prostatectomy. He tolerated this procedure well and without complications. Postoperatively, he was able to be transferred to a regular hospital room following recovery from anesthesia.  He was able to begin ambulating the night of surgery. He remained hemodynamically stable overnight.  He had excellent urine output with appropriately minimal output from his pelvic drain and his pelvic drain was removed on POD #1.  He was transitioned to oral pain medication, tolerated a clear liquid diet, and had met all discharge criteria and was able to be discharged home later on POD#1.  Laboratory values:  Recent Labs  02/23/14 1220 02/24/14 0357  HGB 12.4* 11.0*  HCT 38.1* 35.0*    Disposition: Home  Discharge instruction: He was instructed to be ambulatory but to refrain from heavy lifting, strenuous activity, or driving. He was instructed on urethral catheter care.  Discharge medications:     Medication List    STOP taking these medications       ibuprofen 200 MG tablet  Commonly known as:  ADVIL,MOTRIN     tamsulosin 0.4 MG Caps capsule  Commonly known as:  FLOMAX      TAKE these medications       Cetirizine HCl 10 MG Caps  Take 10 mg by mouth daily.     ciprofloxacin 500 MG tablet  Commonly known as:  CIPRO  Take 1 tablet (500 mg total) by mouth 2 (two) times daily. Start day prior to office visit for foley removal     HYDROcodone-acetaminophen 5-325 MG per  tablet  Commonly known as:  NORCO  Take 1-2 tablets by mouth every 6 (six) hours as needed.     omeprazole 20 MG capsule  Commonly known as:  PRILOSEC  Take 20 mg by mouth daily.        Followup: He will followup in 1 week for catheter removal and to discuss his surgical pathology results.

## 2014-03-03 ENCOUNTER — Emergency Department (HOSPITAL_COMMUNITY): Payer: BC Managed Care – PPO

## 2014-03-03 ENCOUNTER — Emergency Department (HOSPITAL_COMMUNITY)
Admission: EM | Admit: 2014-03-03 | Discharge: 2014-03-03 | Disposition: A | Discharge status: Home / Self Care | Payer: BC Managed Care – PPO | Attending: Emergency Medicine | Admitting: Emergency Medicine

## 2014-03-03 ENCOUNTER — Encounter (HOSPITAL_COMMUNITY): Payer: Self-pay | Admitting: Emergency Medicine

## 2014-03-03 DIAGNOSIS — Z792 Long term (current) use of antibiotics: Secondary | ICD-10-CM | POA: Insufficient documentation

## 2014-03-03 DIAGNOSIS — K219 Gastro-esophageal reflux disease without esophagitis: Secondary | ICD-10-CM | POA: Insufficient documentation

## 2014-03-03 DIAGNOSIS — Z79899 Other long term (current) drug therapy: Secondary | ICD-10-CM | POA: Insufficient documentation

## 2014-03-03 DIAGNOSIS — Z87448 Personal history of other diseases of urinary system: Secondary | ICD-10-CM | POA: Insufficient documentation

## 2014-03-03 DIAGNOSIS — F411 Generalized anxiety disorder: Secondary | ICD-10-CM | POA: Insufficient documentation

## 2014-03-03 DIAGNOSIS — Z8546 Personal history of malignant neoplasm of prostate: Secondary | ICD-10-CM | POA: Insufficient documentation

## 2014-03-03 LAB — COMPREHENSIVE METABOLIC PANEL
ALK PHOS: 98 U/L (ref 39–117)
ALT: 21 U/L (ref 0–53)
AST: 17 U/L (ref 0–37)
Albumin: 3.7 g/dL (ref 3.5–5.2)
BUN: 12 mg/dL (ref 6–23)
CO2: 21 meq/L (ref 19–32)
Calcium: 9.5 mg/dL (ref 8.4–10.5)
Chloride: 98 mEq/L (ref 96–112)
Creatinine, Ser: 0.76 mg/dL (ref 0.50–1.35)
GFR calc Af Amer: >90 mL/min (ref >90)
GFR calc non Af Amer: >90 mL/min (ref >90)
Glucose, Bld: 129 mg/dL — ABNORMAL HIGH (ref 70–99)
Potassium: 3.5 mEq/L — ABNORMAL LOW (ref 3.7–5.3)
SODIUM: 136 meq/L — AB (ref 137–147)
Total Bilirubin: 0.6 mg/dL (ref 0.3–1.2)
Total Protein: 8.4 g/dL — ABNORMAL HIGH (ref 6.0–8.3)

## 2014-03-03 LAB — CBC
HCT: 39.2 % (ref 39.0–52.0)
HEMOGLOBIN: 12.8 g/dL — AB (ref 13.0–17.0)
MCH: 25.9 pg — AB (ref 26.0–34.0)
MCHC: 32.7 g/dL (ref 30.0–36.0)
MCV: 79.2 fL (ref 78.0–100.0)
Platelets: 309 10*3/uL (ref 150–400)
RBC: 4.95 MIL/uL (ref 4.22–5.81)
RDW: 15.1 % (ref 11.5–15.5)
WBC: 7.8 10*3/uL (ref 4.0–10.5)

## 2014-03-03 LAB — APTT: aPTT: 32 seconds (ref 24–37)

## 2014-03-03 LAB — I-STAT TROPONIN, ED: TROPONIN I, POC: 0.01 ng/mL (ref 0.00–0.08)

## 2014-03-03 LAB — PROTIME-INR
INR: 1.13 (ref 0.00–1.49)
Prothrombin Time: 14.3 seconds (ref 11.6–15.2)

## 2014-03-03 LAB — D-DIMER, QUANTITATIVE (NOT AT ARMC): D DIMER QUANT: 1.05 ug{FEU}/mL — AB (ref 0.00–0.48)

## 2014-03-03 MED ORDER — PANTOPRAZOLE SODIUM 40 MG IV SOLR
40.0000 mg | Freq: Once | INTRAVENOUS | Status: AC
  Administered 2014-03-03: 40 mg via INTRAVENOUS
  Filled 2014-03-03: qty 40

## 2014-03-03 MED ORDER — SODIUM CHLORIDE 0.9 % IV SOLN
1000.0000 mL | INTRAVENOUS | Status: DC
  Administered 2014-03-03: 1000 mL via INTRAVENOUS

## 2014-03-03 MED ORDER — IOHEXOL 350 MG/ML SOLN
100.0000 mL | Freq: Once | INTRAVENOUS | Status: AC | PRN
  Administered 2014-03-03: 100 mL via INTRAVENOUS

## 2014-03-03 MED ORDER — ASPIRIN 81 MG PO CHEW
324.0000 mg | CHEWABLE_TABLET | Freq: Once | ORAL | Status: AC
  Administered 2014-03-03: 324 mg via ORAL
  Filled 2014-03-03: qty 4

## 2014-03-03 MED ORDER — LANSOPRAZOLE 15 MG PO CPDR: 15.0000 mg | DELAYED_RELEASE_CAPSULE | Freq: Every day | ORAL | Status: AC

## 2014-03-03 NOTE — ED Notes (Signed)
Pt to CT

## 2014-03-03 NOTE — ED Notes (Signed)
Pt states that this am he began having chest pain mid sternal while at rest. Pt had surgery last week for prostate removal and that he was going back today to have cath removed and staples removed to abd. Pain upper chest non radating. Pt has had some cough and sob intermit since surgery did stay 1 day over night post. Dry skin at this time, no pain, pt did mention he felt like maybe it could be anxiety.

## 2014-03-03 NOTE — Discharge Instructions (Signed)
Gastroesophageal Reflux Disease, Adult  Gastroesophageal reflux disease (GERD) happens when acid from your stomach flows up into the esophagus. When acid comes in contact with the esophagus, the acid causes soreness (inflammation) in the esophagus. Over time, GERD may create small holes (ulcers) in the lining of the esophagus.  CAUSES   · Increased body weight. This puts pressure on the stomach, making acid rise from the stomach into the esophagus.  · Smoking. This increases acid production in the stomach.  · Drinking alcohol. This causes decreased pressure in the lower esophageal sphincter (valve or ring of muscle between the esophagus and stomach), allowing acid from the stomach into the esophagus.  · Late evening meals and a full stomach. This increases pressure and acid production in the stomach.  · A malformed lower esophageal sphincter.  Sometimes, no cause is found.  SYMPTOMS   · Burning pain in the lower part of the mid-chest behind the breastbone and in the mid-stomach area. This may occur twice a week or more often.  · Trouble swallowing.  · Sore throat.  · Dry cough.  · Asthma-like symptoms including chest tightness, shortness of breath, or wheezing.  DIAGNOSIS   Your caregiver may be able to diagnose GERD based on your symptoms. In some cases, X-rays and other tests may be done to check for complications or to check the condition of your stomach and esophagus.  TREATMENT   Your caregiver may recommend over-the-counter or prescription medicines to help decrease acid production. Ask your caregiver before starting or adding any new medicines.   HOME CARE INSTRUCTIONS   · Change the factors that you can control. Ask your caregiver for guidance concerning weight loss, quitting smoking, and alcohol consumption.  · Avoid foods and drinks that make your symptoms worse, such as:  · Caffeine or alcoholic drinks.  · Chocolate.  · Peppermint or mint flavorings.  · Garlic and onions.  · Spicy foods.  · Citrus fruits,  such as oranges, lemons, or limes.  · Tomato-based foods such as sauce, chili, salsa, and pizza.  · Fried and fatty foods.  · Avoid lying down for the 3 hours prior to your bedtime or prior to taking a nap.  · Eat small, frequent meals instead of large meals.  · Wear loose-fitting clothing. Do not wear anything tight around your waist that causes pressure on your stomach.  · Raise the head of your bed 6 to 8 inches with wood blocks to help you sleep. Extra pillows will not help.  · Only take over-the-counter or prescription medicines for pain, discomfort, or fever as directed by your caregiver.  · Do not take aspirin, ibuprofen, or other nonsteroidal anti-inflammatory drugs (NSAIDs).  SEEK IMMEDIATE MEDICAL CARE IF:   · You have pain in your arms, neck, jaw, teeth, or back.  · Your pain increases or changes in intensity or duration.  · You develop nausea, vomiting, or sweating (diaphoresis).  · You develop shortness of breath, or you faint.  · Your vomit is green, yellow, black, or looks like coffee grounds or blood.  · Your stool is red, bloody, or black.  These symptoms could be signs of other problems, such as heart disease, gastric bleeding, or esophageal bleeding.  MAKE SURE YOU:   · Understand these instructions.  · Will watch your condition.  · Will get help right away if you are not doing well or get worse.  Document Released: 09/11/2005 Document Revised: 02/24/2012 Document Reviewed: 06/21/2011  [employer]® Patient   Information ©2014 [employer].

## 2014-03-03 NOTE — ED Provider Notes (Signed)
CSN: [id number]     Arrival date & time 03/03/14  1048 History   First MD Initiated Contact with Patient 03/03/14 1058     Chief Complaint  Patient presents with  . Chest Pain     HPI Pt had a prostatectomy last week.  Pt has been having trouble with discomfort with the catheter and has not been sleeping well.  Today he was lying in his recliner trying to sleep and he felt like he had some difficulty with his breathing.  He felt a tightness in his chest that was uncomfortable.  He thought he had a little indigestion, a burning sensation,  and took some mylanta with relief of the burning but the tightness persisted.  It has lasted for four hours at this time.  He still feels short of breath.  He has been anxious about his urinary catheter and it was supposed to be pulled today at 1530.  He has an appointment with his urologist.  No leg swelling.  No fevers or cough.  No history of heart or lung disease.  No history of DVT, PE.  FmHx of Aunt with fatal MI at age 54.  Past Medical History  Diagnosis Date  . Heartburn   . Pruritus   . Polydipsia   . Back pain   . Joint pain   . BPH with urinary obstruction   . Prostate cancer 12/28/13    Gleason 3+3=6, volume 33 gm  . GERD (gastroesophageal reflux disease)   . Anxiety     regarding surgery   Past Surgical History  Procedure Laterality Date  . Prostate biopsy  12/28/13    Gleason 6, vol 33 gm  . Tooth extraction    . Tonsillectomy    . Robot assisted laparoscopic radical prostatectomy N/A 02/23/2014    Procedure: ROBOTIC ASSISTED LAPAROSCOPIC RADICAL PROSTATECTOMY;  Surgeon: Bernestine Amass, MD;  Location: [facility];  Service: Urology;  Laterality: N/A;   Family History  Problem Relation Age of Onset  . Diabetes Father   . Cancer Father     prostate  . Diabetes Paternal Grandfather   . Cancer Paternal Grandfather     prostate  . Cancer Maternal Grandfather     throat   History  Substance Use Topics  . Smoking status: Never  Smoker   . Smokeless tobacco: Never Used  . Alcohol Use: No    Review of Systems  All other systems reviewed and are negative.      Allergies  Review of patient's allergies indicates no known allergies.  Home Medications   Current Outpatient Rx  Name  Route  Sig  Dispense  Refill  . acetaminophen (TYLENOL) 500 MG tablet   Oral   Take 1,000 mg by mouth every 6 (six) hours as needed for mild pain.         . Cetirizine HCl 10 MG CAPS   Oral   Take 10 mg by mouth daily.          . ciprofloxacin (CIPRO) 500 MG tablet   Oral   Take 500 mg by mouth 2 (two) times daily. For 3 days         . HYDROcodone-acetaminophen (NORCO/VICODIN) 5-325 MG per tablet   Oral   Take 1 tablet by mouth every 8 (eight) hours as needed for moderate pain.         Marland Kitchen omeprazole (PRILOSEC) 20 MG capsule   Oral   Take 20 mg by mouth daily.         Marland Kitchen  oxybutynin (DITROPAN) 5 MG tablet   Oral   Take 1 tablet (5 mg total) by mouth every 8 (eight) hours as needed for bladder spasms.   20 tablet   0   . lansoprazole (PREVACID) 15 MG capsule   Oral   Take 1 capsule (15 mg total) by mouth daily at 12 noon.   14 capsule   1    BP 131/94  Pulse 78  Resp 17  SpO2 96% Physical Exam  Nursing note and vitals reviewed. Constitutional: He appears well-developed and well-nourished. No distress.  HENT:  Head: Normocephalic and atraumatic.  Right Ear: External ear normal.  Left Ear: External ear normal.  Eyes: Conjunctivae are normal. Right eye exhibits no discharge. Left eye exhibits no discharge. No scleral icterus.  Neck: Neck supple. No tracheal deviation present.  Cardiovascular: Normal rate, regular rhythm and intact distal pulses.   Pulmonary/Chest: Effort normal and breath sounds normal. No stridor. No respiratory distress. He has no wheezes. He has no rales.  Abdominal: Soft. Bowel sounds are normal. He exhibits no distension. There is no tenderness. There is no rebound and no  guarding.  Musculoskeletal: He exhibits no edema and no tenderness.  Neurological: He is alert. He has normal strength. No cranial nerve deficit (no facial droop, extraocular movements intact, no slurred speech) or sensory deficit. He exhibits normal muscle tone. He displays no seizure activity. Coordination normal.  Skin: Skin is warm and dry. No rash noted.  Psychiatric: He has a normal mood and affect.    ED Course  Procedures (including critical care time) Labs Review Labs Reviewed  CBC - Abnormal; Notable for the following:    Hemoglobin 12.8 (*)    MCH 25.9 (*)    All other components within normal limits  COMPREHENSIVE METABOLIC PANEL - Abnormal; Notable for the following:    Sodium 136 (*)    Potassium 3.5 (*)    Glucose, Bld 129 (*)    Total Protein 8.4 (*)    All other components within normal limits  D-DIMER, QUANTITATIVE - Abnormal; Notable for the following:    D-Dimer, Quant 1.05 (*)    All other components within normal limits  APTT  PROTIME-INR  Randolm Idol, ED   Imaging Review Dg Chest 2 View  03/03/2014   CLINICAL DATA:  Shortness of breath  EXAM: CHEST  2 VIEW  COMPARISON:  None.  FINDINGS: The heart size and mediastinal contours are within normal limits. Both lungs are clear. The visualized skeletal structures are unremarkable.  IMPRESSION: No active cardiopulmonary disease.   Electronically Signed   By: Inez Catalina M.D.   On: 03/03/2014 11:59   Ct Angio Chest W/cm &/or Wo Cm  03/03/2014   CLINICAL DATA:  Chest pain and tightness, new diagnosis of prostate malignancy status post prostatectomy on February 23, 2014  EXAM: CT ANGIOGRAPHY CHEST WITH CONTRAST  TECHNIQUE: Multidetector CT imaging of the chest was performed using the standard protocol during bolus administration of intravenous contrast. Multiplanar CT image reconstructions and MIPs were obtained to evaluate the vascular anatomy.  CONTRAST:  165mL OMNIPAQUE IOHEXOL 350 MG/ML SOLN  COMPARISON:  None.   FINDINGS: Contrast within the pulmonary arterial tree is normal in appearance. There are no filling defects to suggest an acute pulmonary embolism. The caliber of the thoracic aorta is normal. There is no evidence of a false lumen. The cardiac chambers are normal in size. There is no pleural nor pericardial effusion. There is no mediastinal or hilar  lymphadenopathy. The retrosternal soft tissues appear normal. The thyroid lobes are normal in density and symmetric in size.  At lung window settings there is mild respiratory motion artifact. Subsegmental atelectasis in both lower lobes posteriorly is suspected, but this is contributed to by motion artifact. No parenchymal nodules or alveolar infiltrates are demonstrated.  The visualized portions of the thoracic spine exhibit no acute abnormalities. The sternum appears intact. The observed portions of the ribs exhibit no acute abnormalities.  Within the upper abdomen the observed portions of the liver and spleen exhibit no acute abnormalities. There is hypodensity in the left hepatic lobe most compatible with a cyst measuring 2 x 1.5 cm with HU measurement of -4.  Review of the MIP images confirms the above findings.  IMPRESSION: 1. There is no evidence of an acute pulmonary embolism or acute thoracic aortic pathology. 2. There is no evidence of CHF or pneumonia. There is minimal subsegmental atelectasis in the lower lobes but evaluation of the lungs is limited by respiratory motion artifact. 3. There is no mediastinal or hilar lymphadenopathy. There is no pleural nor pericardial effusion.   Electronically Signed   By: David  Martinique   On: 03/03/2014 14:59     EKG Interpretation   Date/Time:  Thursday March 03 2014 10:56:30 EDT Ventricular Rate:  97 PR Interval:  149 QRS Duration: 109 QT Interval:  374 QTC Calculation: 475 R Axis:   10 Text Interpretation:  Sinus rhythm Minimal ST depression, anterolateral  leads Borderline prolonged QT interval No previous  tracing Confirmed by  Ashlye Oviedo  MD-J, Rotunda Worden ([id number]) on 03/03/2014 10:59:09 AM     1522  Pt is feeling fine.  Heart rate in the 70s MDM   Final diagnoses:  GERD (gastroesophageal reflux disease)    Patient states he has been having some trouble with heartburn. He has also been a little bit anxious about his upcoming catheter removal.  Symptoms started this morning and have been ongoing for several hours before arrival in the emergency department.  Patient's cardiac enzyme is normal. His EKG is not suggestive of acute ischemia. Patient's d-dimer was elevated and he had recent surgery the CT scan does not demonstrate pulmonary embolus.  I will treat the patient with with antacids. Followup with his primary Dr. return as needed for worsening symptoms    Kathalene Frames, MD 03/03/14 1531

## 2014-12-13 ENCOUNTER — Ambulatory Visit: Payer: Self-pay | Admitting: Family Medicine

## 2015-01-03 ENCOUNTER — Ambulatory Visit: Payer: Self-pay | Admitting: Family Medicine

## 2015-07-14 IMAGING — CR DG CHEST 2V
2 series · 2 of 2 positions shown · non-contrast
Comparison: None.

CLINICAL DATA: Shortness of breath

EXAM:
CHEST  2 VIEW

[w chest pa]
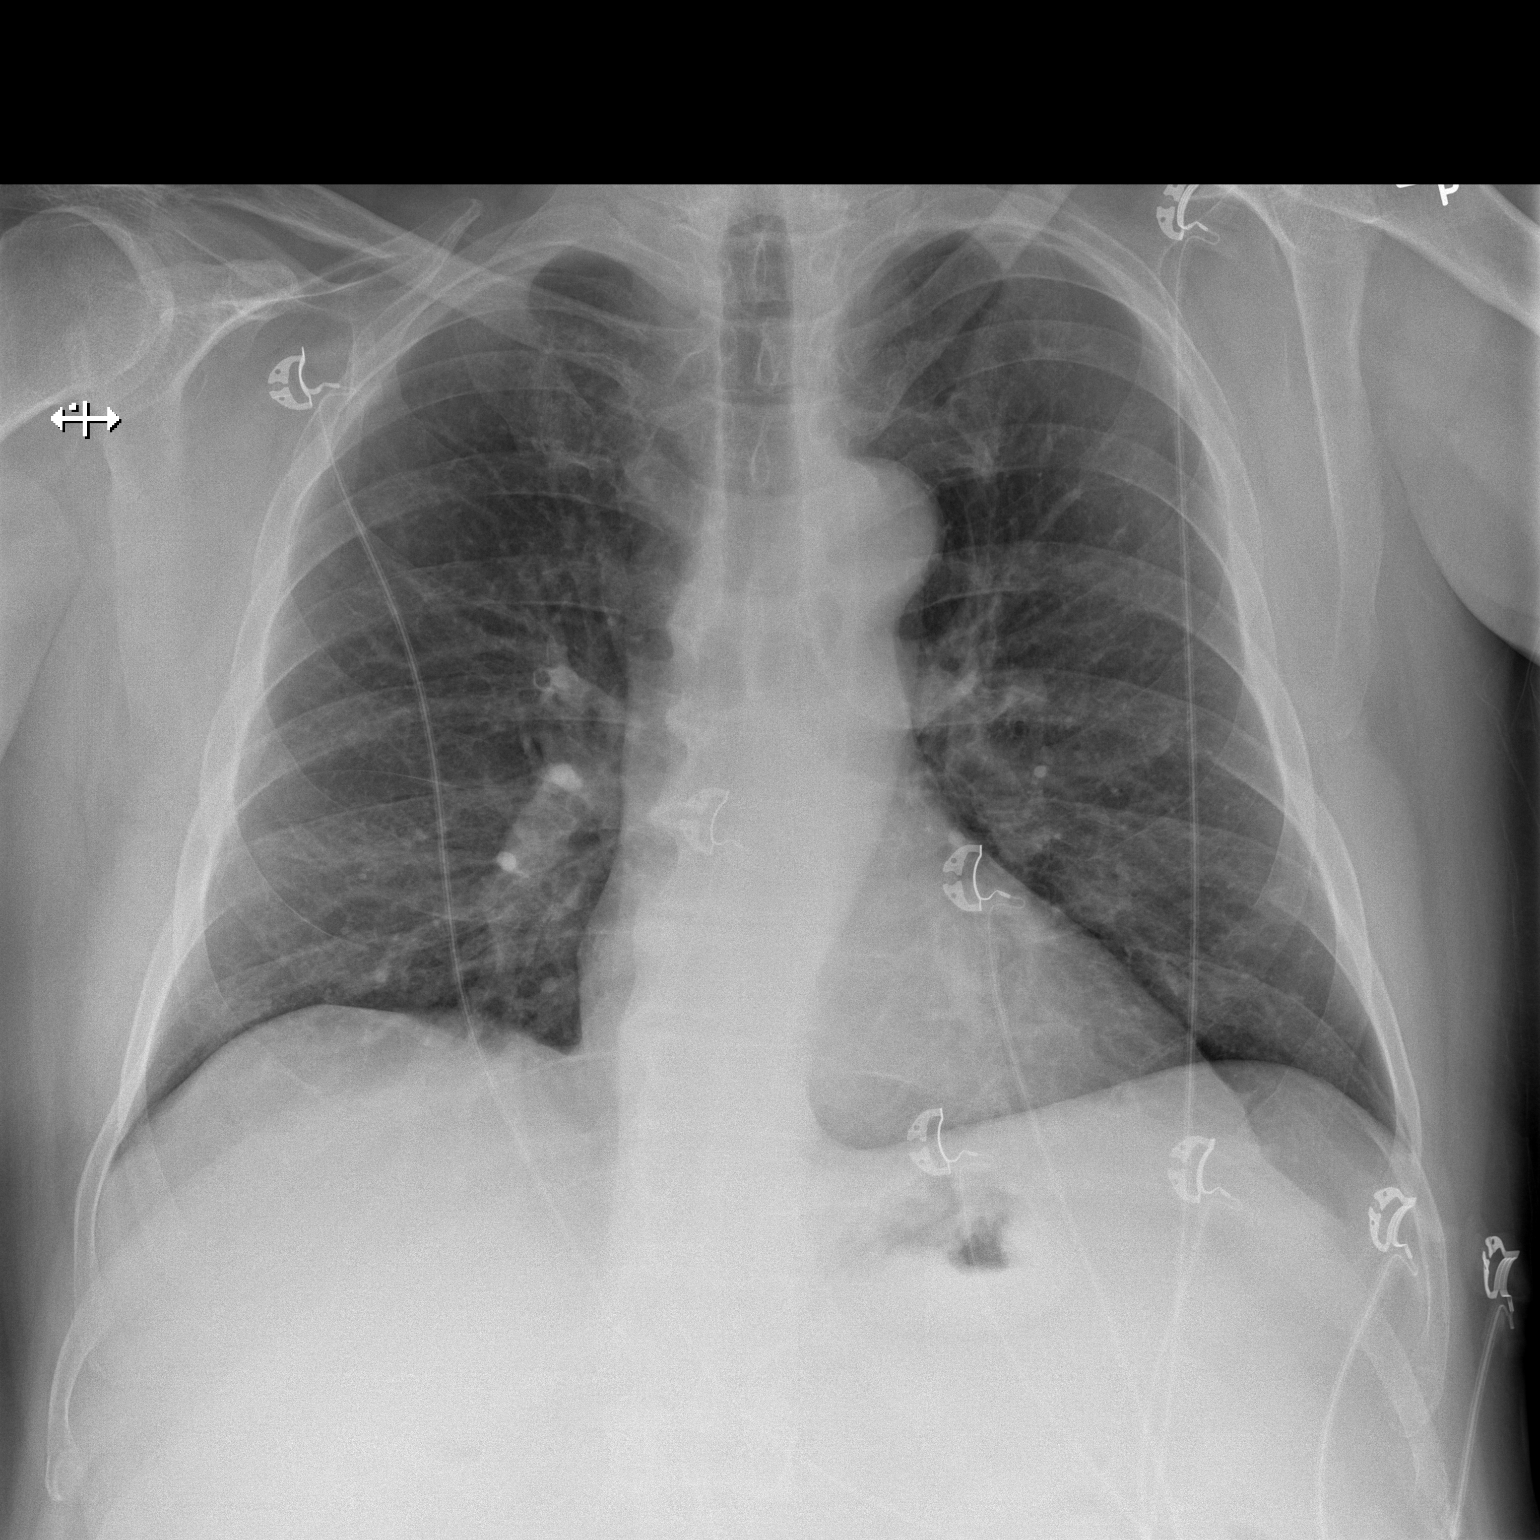

[w chest lat]
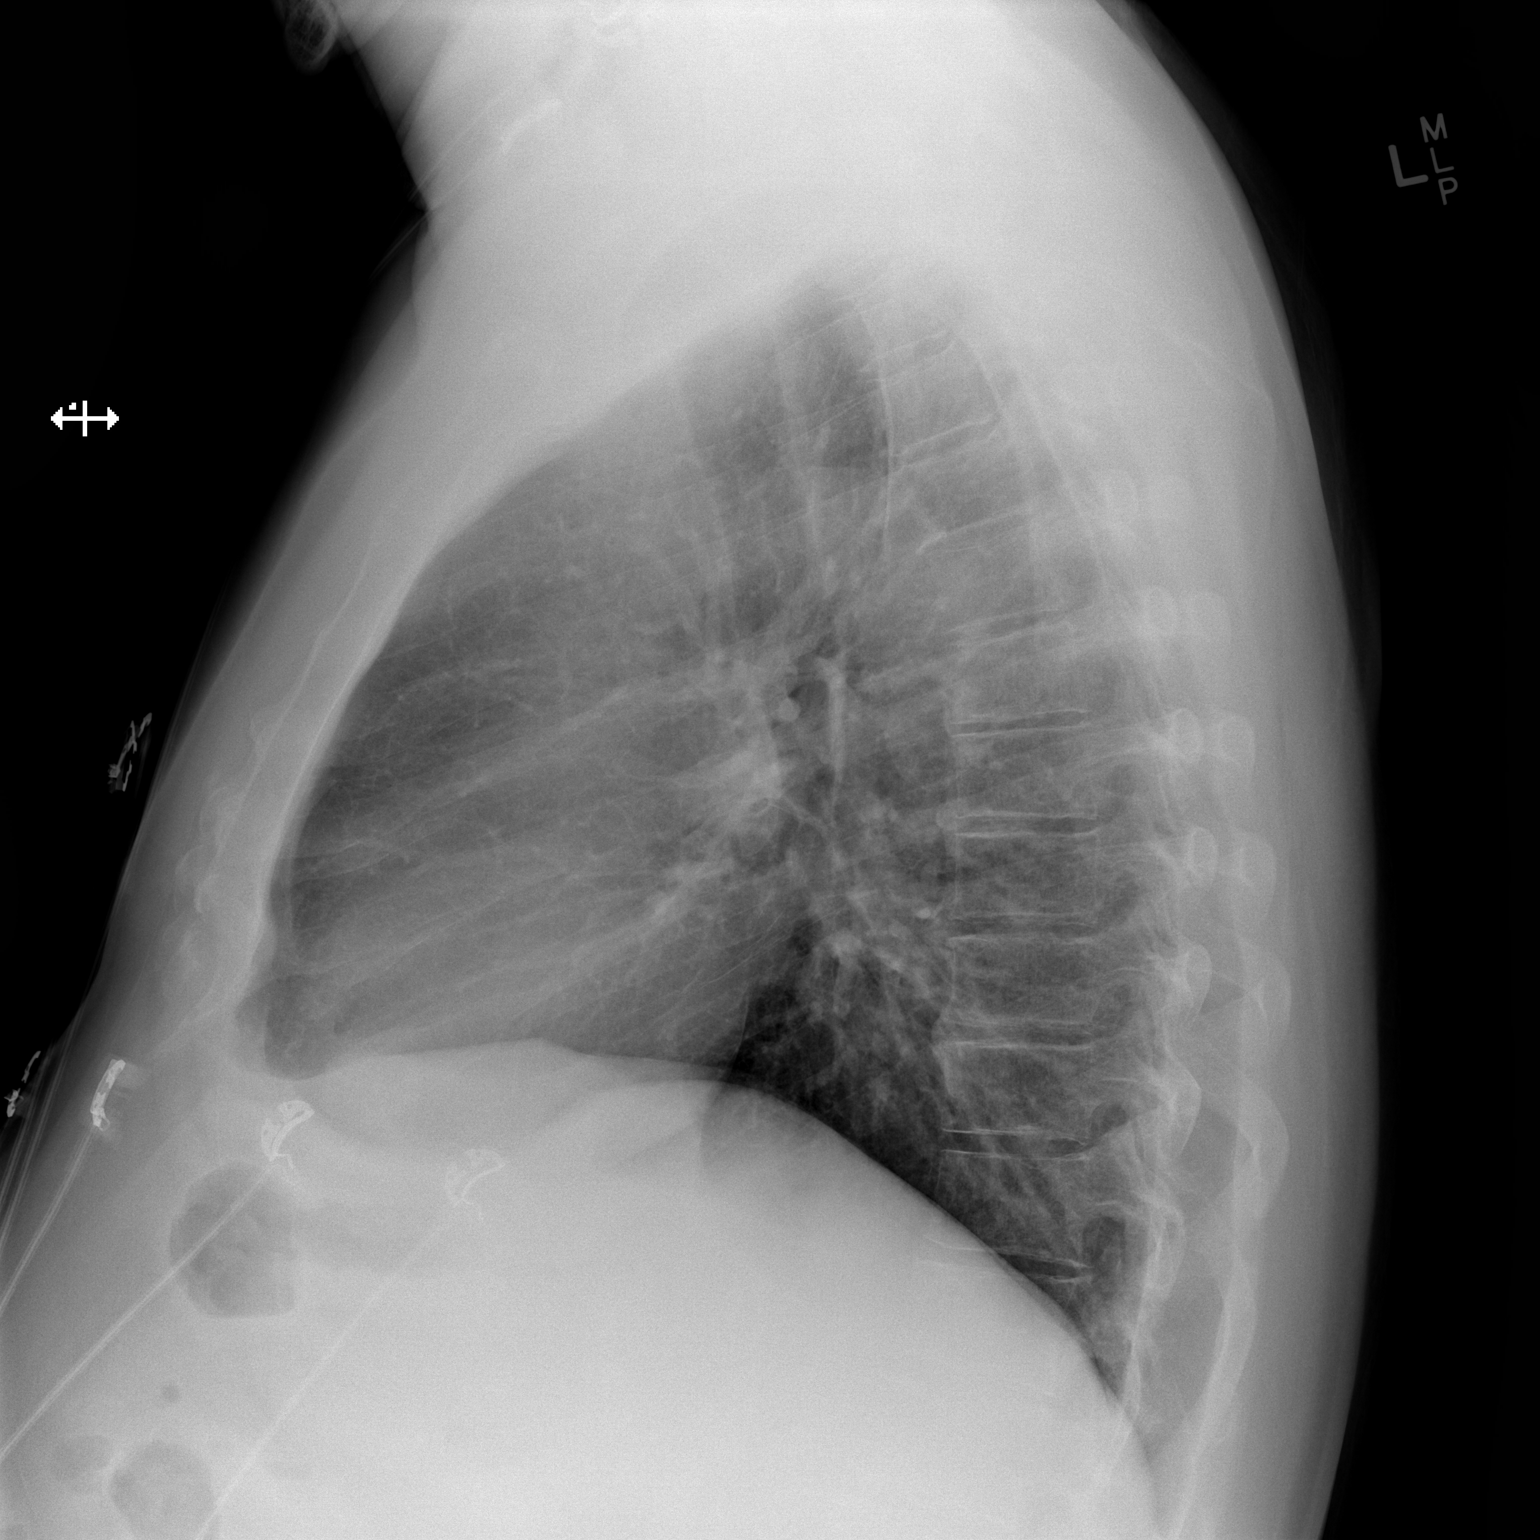

[2 of 2 positions shown; findings below may reference images not displayed]

FINDINGS: The heart size and mediastinal contours are within normal limits.
Both lungs are clear. The visualized skeletal structures are
unremarkable.
IMPRESSION: No active cardiopulmonary disease.

## 2015-07-14 IMAGING — CT CT ANGIO CHEST
1 of 2 series · 19 of 32 positions shown · IV contrast (OMNIPAQUE 300)
Comparison: None.

CLINICAL DATA: Chest pain and tightness, new diagnosis of prostate
malignancy status post prostatectomy on February 23, 2014

EXAM:
CT ANGIOGRAPHY CHEST WITH CONTRAST
TECHNIQUE: Multidetector CT imaging of the chest was performed using the
standard protocol during bolus administration of intravenous
contrast. Multiplanar CT image reconstructions and MIPs were
obtained to evaluate the vascular anatomy.
CONTRAST:  100mL OMNIPAQUE IOHEXOL 350 MG/ML SOLN

[Series 10: thins for pacs · axial · 0.92mm/px · z∈[-225,+23]mm · 19 of 276 slices shown]
[im 14/276  lung]
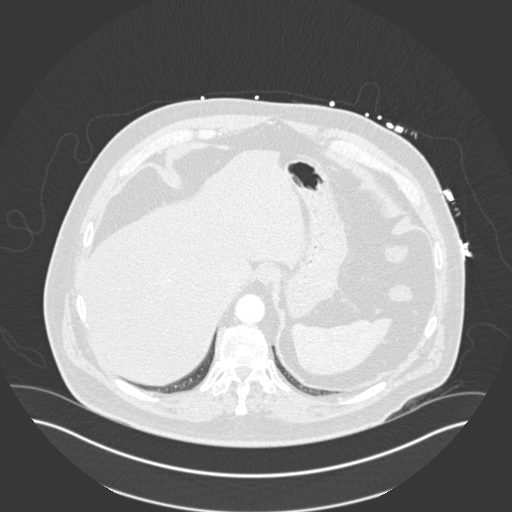
[im 28/276  mediastinal]
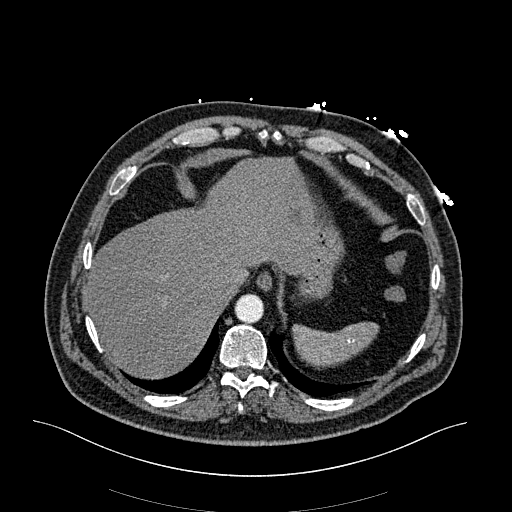
[im 42/276  lung]
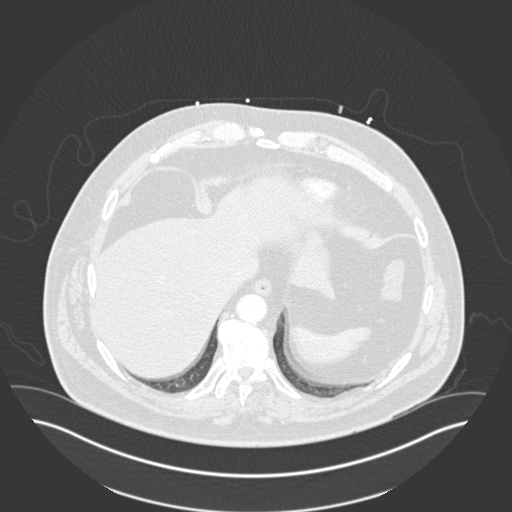
[im 69/276  mediastinal]
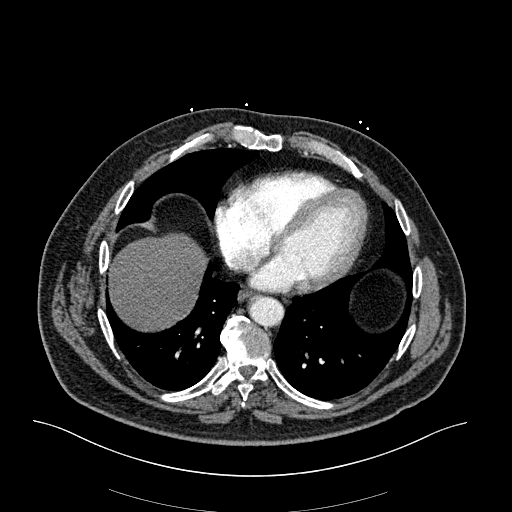
[im 83/276  lung]
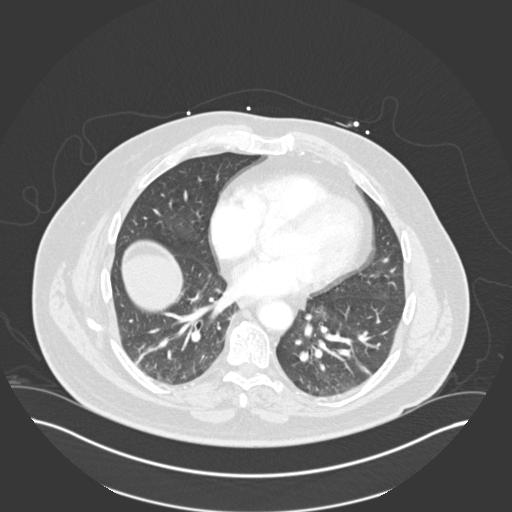
[im 92/276  mediastinal]
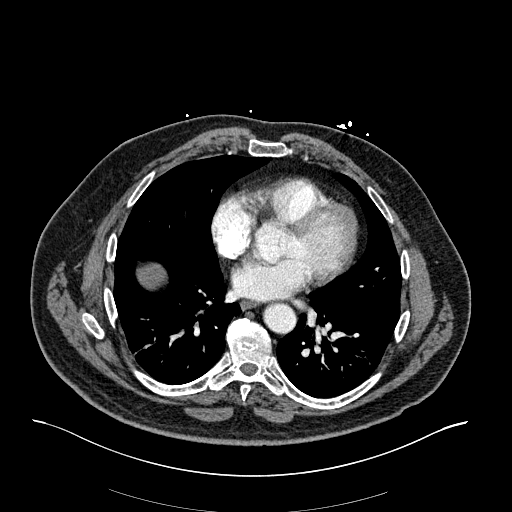
[im 97/276  lung]
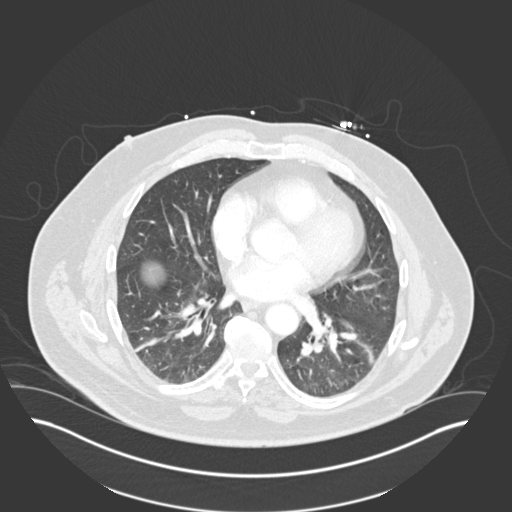
[im 111/276  mediastinal]
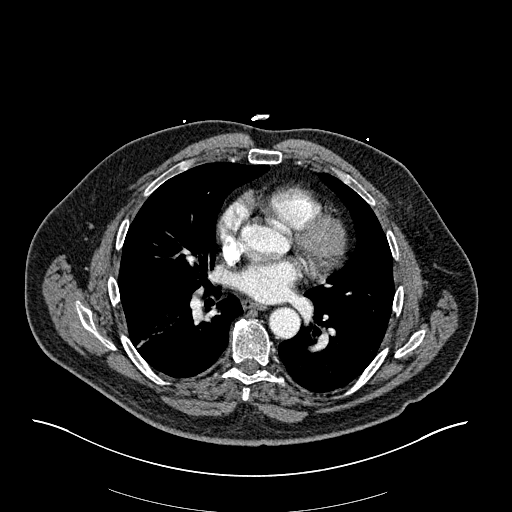
[im 124/276  lung]
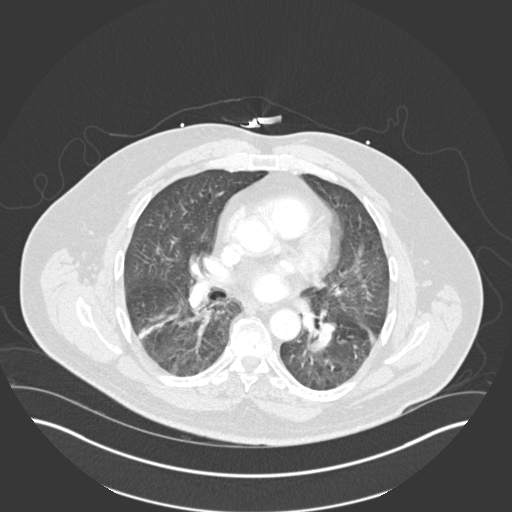
[im 138/276  mediastinal]
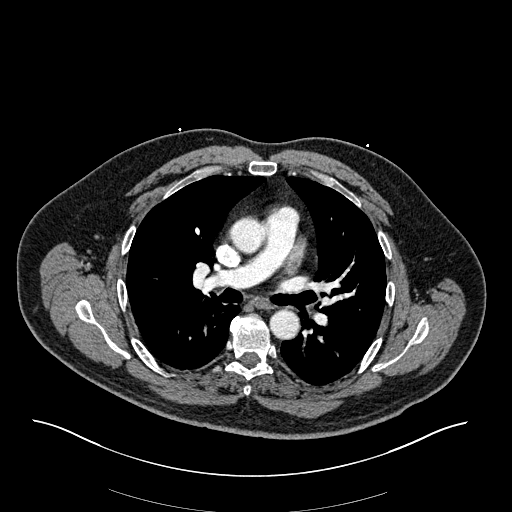
[im 152/276  lung]
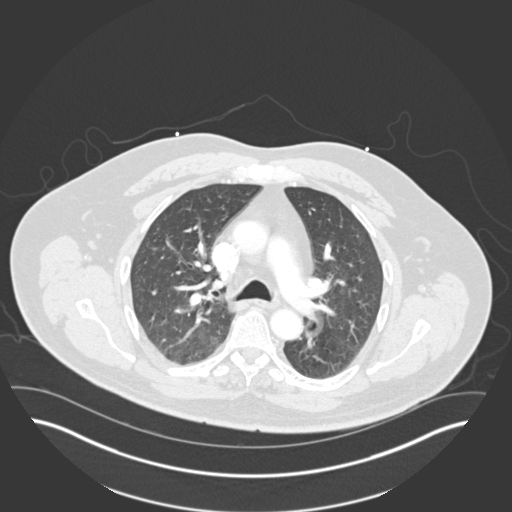
[im 166/276  mediastinal]
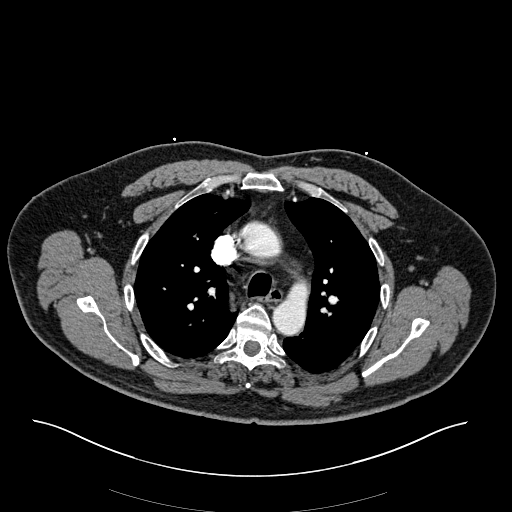
[im 179/276  lung]
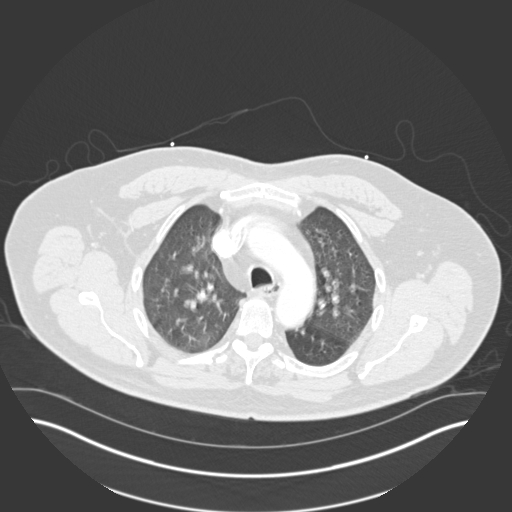
[im 184/276  mediastinal]
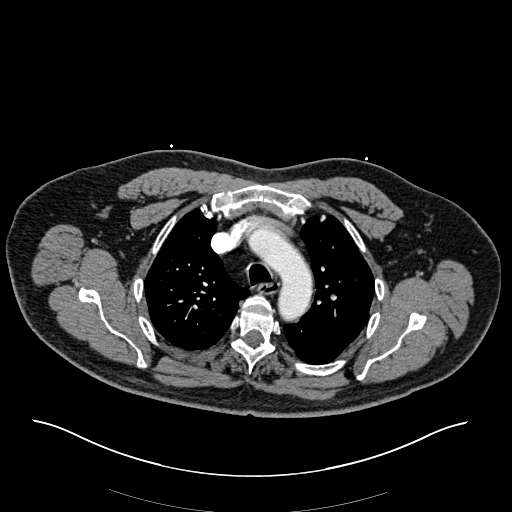
[im 193/276  lung]
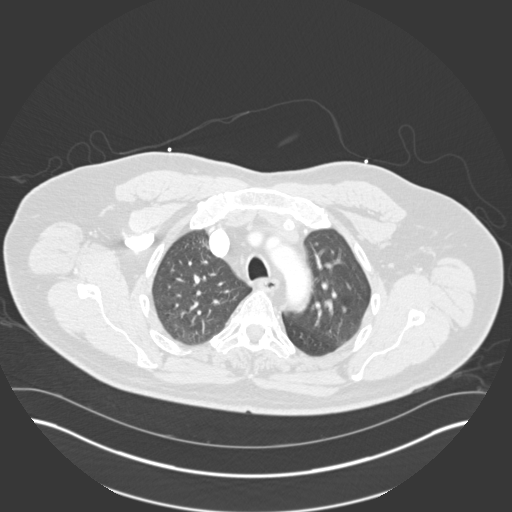
[im 207/276  mediastinal]
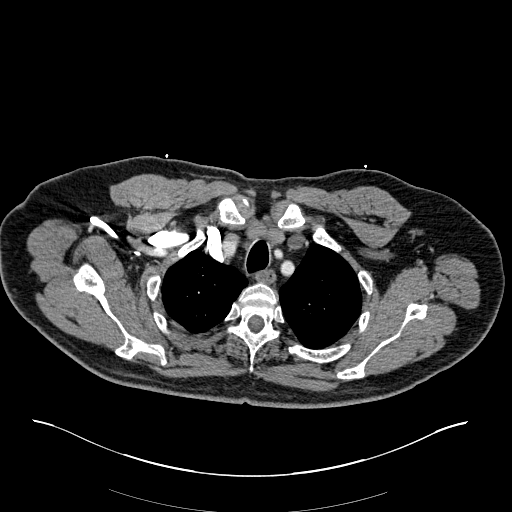
[im 234/276  lung]
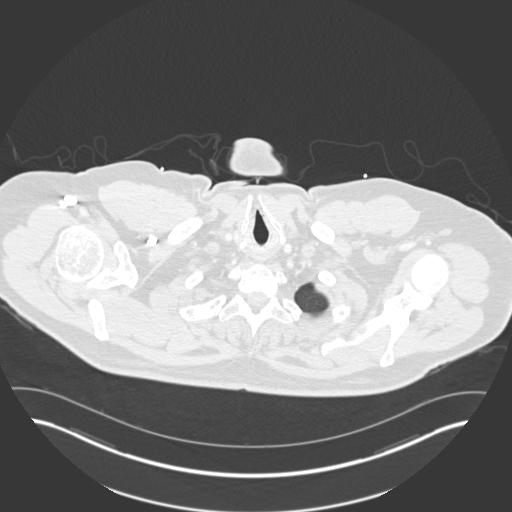
[im 248/276  mediastinal]
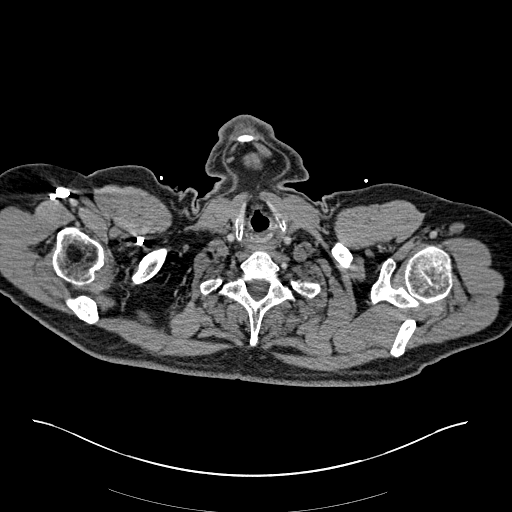
[im 262/276  lung]
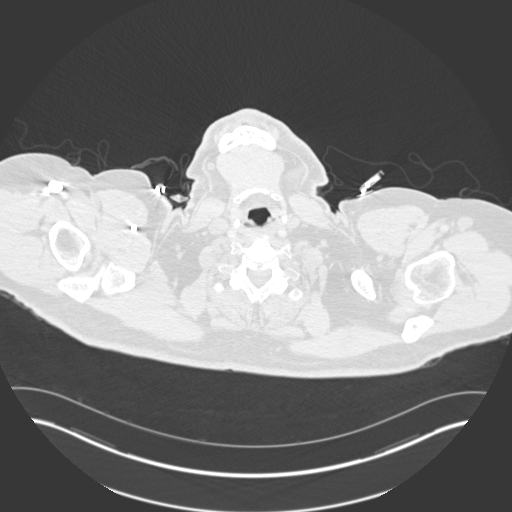

[19 of 32 positions shown; findings below may reference images not displayed]

FINDINGS: Contrast within the pulmonary arterial tree is normal in appearance.
There are no filling defects to suggest an acute pulmonary embolism.
The caliber of the thoracic aorta is normal. There is no evidence of
a false lumen. The cardiac chambers are normal in size. There is no
pleural nor pericardial effusion. There is no mediastinal or hilar
lymphadenopathy. The retrosternal soft tissues appear normal. The
thyroid lobes are normal in density and symmetric in size.

At lung window settings there is mild respiratory motion artifact.
Subsegmental atelectasis in both lower lobes posteriorly is
suspected, but this is contributed to by motion artifact. No
parenchymal nodules or alveolar infiltrates are demonstrated.

The visualized portions of the thoracic spine exhibit no acute
abnormalities. The sternum appears intact. The observed portions of
the ribs exhibit no acute abnormalities.

Within the upper abdomen the observed portions of the liver and
spleen exhibit no acute abnormalities. There is hypodensity in the
left hepatic lobe most compatible with a cyst measuring 2 x 1.5 cm
with HU measurement of -4.

Review of the MIP images confirms the above findings.
IMPRESSION: 1. There is no evidence of an acute pulmonary embolism or acute
thoracic aortic pathology.
2. There is no evidence of CHF or pneumonia. There is minimal
subsegmental atelectasis in the lower lobes but evaluation of the
lungs is limited by respiratory motion artifact.
3. There is no mediastinal or hilar lymphadenopathy. There is no
pleural nor pericardial effusion.

## 2024-08-17 ENCOUNTER — Encounter: Payer: Self-pay | Admitting: Internal Medicine

## 2024-08-25 ENCOUNTER — Ambulatory Visit: Admitting: General Practice

## 2024-08-25 ENCOUNTER — Encounter: Payer: Self-pay | Admitting: Internal Medicine

## 2024-08-25 ENCOUNTER — Encounter
Admission: RE | Disposition: A | Discharge status: Home / Self Care | Payer: Self-pay | Source: Home / Self Care | Attending: Internal Medicine

## 2024-08-25 ENCOUNTER — Ambulatory Visit
Admission: RE | Admit: 2024-08-25 | Discharge: 2024-08-25 | Disposition: A | Discharge status: Home / Self Care | Payer: Self-pay | Attending: Internal Medicine | Admitting: Internal Medicine

## 2024-08-25 ENCOUNTER — Other Ambulatory Visit: Payer: Self-pay

## 2024-08-25 DIAGNOSIS — E119 Type 2 diabetes mellitus without complications: Secondary | ICD-10-CM | POA: Diagnosis not present

## 2024-08-25 DIAGNOSIS — K219 Gastro-esophageal reflux disease without esophagitis: Secondary | ICD-10-CM | POA: Diagnosis not present

## 2024-08-25 DIAGNOSIS — Z79899 Other long term (current) drug therapy: Secondary | ICD-10-CM | POA: Insufficient documentation

## 2024-08-25 DIAGNOSIS — J45909 Unspecified asthma, uncomplicated: Secondary | ICD-10-CM | POA: Insufficient documentation

## 2024-08-25 DIAGNOSIS — Z7984 Long term (current) use of oral hypoglycemic drugs: Secondary | ICD-10-CM | POA: Diagnosis not present

## 2024-08-25 DIAGNOSIS — Z7985 Long-term (current) use of injectable non-insulin antidiabetic drugs: Secondary | ICD-10-CM | POA: Insufficient documentation

## 2024-08-25 DIAGNOSIS — K5730 Diverticulosis of large intestine without perforation or abscess without bleeding: Secondary | ICD-10-CM | POA: Diagnosis not present

## 2024-08-25 DIAGNOSIS — Z1211 Encounter for screening for malignant neoplasm of colon: Secondary | ICD-10-CM | POA: Diagnosis present

## 2024-08-25 DIAGNOSIS — D122 Benign neoplasm of ascending colon: Secondary | ICD-10-CM | POA: Diagnosis not present

## 2024-08-25 DIAGNOSIS — G4730 Sleep apnea, unspecified: Secondary | ICD-10-CM | POA: Insufficient documentation

## 2024-08-25 DIAGNOSIS — K640 First degree hemorrhoids: Secondary | ICD-10-CM | POA: Insufficient documentation

## 2024-08-25 HISTORY — DX: Gastro-esophageal reflux disease without esophagitis: K21.9

## 2024-08-25 HISTORY — DX: Allergic rhinitis, unspecified: J30.9

## 2024-08-25 HISTORY — DX: Type 2 diabetes mellitus without complications: E11.9

## 2024-08-25 HISTORY — DX: Sleep apnea, unspecified: G47.30

## 2024-08-25 HISTORY — DX: Unspecified asthma, uncomplicated: J45.909

## 2024-08-25 HISTORY — PX: COLONOSCOPY: SHX5424

## 2024-08-25 HISTORY — PX: POLYPECTOMY: SHX149

## 2024-08-25 LAB — GLUCOSE, CAPILLARY: Glucose-Capillary: 100 mg/dL — ABNORMAL HIGH (ref 70–99)

## 2024-08-25 SURGERY — COLONOSCOPY
Anesthesia: General

## 2024-08-25 MED ORDER — SODIUM CHLORIDE 0.9 % IV SOLN: INTRAVENOUS | Status: DC

## 2024-08-25 MED ORDER — LIDOCAINE HCL (PF) 2 % IJ SOLN
INTRAMUSCULAR | Status: DC | PRN
  Administered 2024-08-25: 40 mg via INTRADERMAL

## 2024-08-25 MED ORDER — PROPOFOL 10 MG/ML IV BOLUS
INTRAVENOUS | Status: DC | PRN
  Administered 2024-08-25: 30 mg via INTRAVENOUS
  Administered 2024-08-25: 40 mg via INTRAVENOUS
  Administered 2024-08-25 (×4): 30 mg via INTRAVENOUS
  Administered 2024-08-25: 50 mg via INTRAVENOUS
  Administered 2024-08-25 (×3): 30 mg via INTRAVENOUS

## 2024-08-25 NOTE — Op Note (Signed)
 [facility] Gastroenterology Patient Name: Charles Buchanan Procedure Date: 08/25/2024 10:17 AM MRN: [id number] Account #: [id number] Date of Birth: 12/15/1961 Admit Type: Outpatient Age: 63 Room: [facility] ENDO ROOM 2 Gender: Male Note Status: Finalized Instrument Name: Colon Scope [id number] Procedure:             Colonoscopy Indications:           High risk colon cancer surveillance: Personal history                         of adenoma less than 10 mm in size Providers:             Chelcie Estorga K. Gavriella Hearst MD, MD Medicines:             Propofol  per Anesthesia Complications:         No immediate complications. Estimated blood loss:                         Minimal. Procedure:             Pre-Anesthesia Assessment:                        - The risks and benefits of the procedure and the                         sedation options and risks were discussed with the                         patient. All questions were answered and informed                         consent was obtained.                        - Patient identification and proposed procedure were                         verified prior to the procedure by the nurse. The                         procedure was verified in the procedure room.                        - ASA Grade Assessment: III - A patient with severe                         systemic disease.                        - After reviewing the risks and benefits, the patient                         was deemed in satisfactory condition to undergo the                         procedure.                        After obtaining informed consent, the colonoscope was  passed under direct vision. Throughout the procedure,                         the patient's blood pressure, pulse, and oxygen                         saturations were monitored continuously. The                         Colonoscope was introduced through the anus and                          advanced to the the cecum, identified by appendiceal                         orifice and ileocecal valve. The colonoscopy was                         performed without difficulty. The patient tolerated                         the procedure well. The quality of the bowel                         preparation was adequate. The ileocecal valve,                         appendiceal orifice, and rectum were photographed. Findings:      The perianal and digital rectal examinations were normal. Pertinent       negatives include normal sphincter tone and no palpable rectal lesions.      Non-bleeding internal hemorrhoids were found during retroflexion. The       hemorrhoids were Grade I (internal hemorrhoids that do not prolapse).      A few small-mouthed diverticula were found in the sigmoid colon.      A 5 mm polyp was found in the ascending colon. The polyp was sessile.       The polyp was removed with a cold snare. Resection was complete, but the       polyp tissue was not retrieved. Estimated blood loss was minimal.      The exam was otherwise without abnormality. Impression:            - Non-bleeding internal hemorrhoids.                        - Diverticulosis in the sigmoid colon.                        - One 5 mm polyp in the ascending colon, removed with                         a cold snare. Complete resection. Polyp tissue not                         retrieved.                        - The examination was otherwise normal. Recommendation:        - Patient has  a contact number available for                         emergencies. The signs and symptoms of potential                         delayed complications were discussed with the patient.                         Return to normal activities tomorrow. Written                         discharge instructions were provided to the patient.                        - Resume previous diet.                        - Continue present medications.                         - Repeat colonoscopy in 5 years for surveillance.                        - Return to GI office PRN.                        - The findings and recommendations were discussed with                         the patient. Procedure Code(s):     --- Professional ---                        [phone], Colonoscopy, flexible; with removal of                         tumor(s), polyp(s), or other lesion(s) by snare                         technique Diagnosis Code(s):     --- Professional ---                        K57.30, Diverticulosis of large intestine without                         perforation or abscess without bleeding                        D12.2, Benign neoplasm of ascending colon                        K64.0, First degree hemorrhoids                        Z86.010, Personal history of colonic polyps CPT copyright 2022 [employer]. All rights reserved. The codes documented in this report are preliminary and upon coder review may  be revised to meet current compliance requirements. Ladell MARLA Boss MD, MD 08/25/2024 10:56:18 AM This report has been signed electronically. Number of Addenda: 0 Note Initiated On: 08/25/2024 10:17 AM  Scope Withdrawal Time: 0 hours 15 minutes 35 seconds  Total Procedure Duration: 0 hours 21 minutes 41 seconds  Estimated Blood Loss:  Estimated blood loss was minimal.      [facility]

## 2024-08-25 NOTE — Transfer of Care (Signed)
 Immediate Anesthesia Transfer of Care Note  Patient: Charles Buchanan  Procedure(s) Performed: COLONOSCOPY POLYPECTOMY, INTESTINE  Patient Location: PACU  Anesthesia Type:MAC  Level of Consciousness: drowsy  Airway & Oxygen Therapy: Patient Spontanous Breathing and Patient connected to nasal cannula oxygen  Post-op Assessment: Report given to RN and Post -op Vital signs reviewed and stable  Post vital signs: Reviewed and stable  Last Vitals:  Vitals Value Taken Time  BP 109/77   Temp    Pulse 73 08/25/24 10:55  Resp 13 08/25/24 10:55  SpO2 94 % 08/25/24 10:55  Vitals shown include unfiled device data.  Last Pain:  Vitals:   08/25/24 1005  TempSrc: Tympanic         Complications: No notable events documented.

## 2024-08-25 NOTE — H&P (Signed)
 Outpatient short stay form Pre-procedure 08/25/2024 10:24 AM Charles Buchanan, M.D.  Primary Physician: Charles Buchanan, M.D.  Reason for visit:  Personal history of adenomatous colon polyps  History of present illness:                            Patient presents for colonoscopy for a personal hx of colon polyps. The patient denies abdominal pain, abnormal weight loss or rectal bleeding.      Current Facility-Administered Medications:    0.9 %  sodium chloride  infusion, , Intravenous, Continuous, Muniz, Amarius Toto K, MD, Last Rate: 20 mL/hr at 08/25/24 1021, Continued from Pre-op at 08/25/24 1021  Medications Prior to Admission  Medication Sig Dispense Refill Last Dose/Taking   metFORMIN (GLUCOPHAGE) 500 MG tablet Take 500 mg by mouth 2 (two) times daily with a meal.   Past Week   oxybutynin  (DITROPAN ) 5 MG tablet Take 1 tablet (5 mg total) by mouth every 8 (eight) hours as needed for bladder spasms. 20 tablet 0 Past Week   tirzepatide (MOUNJARO) 10 MG/0.5ML Pen Inject 10 mg into the skin once a week.   08/16/2024   acetaminophen  (TYLENOL ) 500 MG tablet Take 1,000 mg by mouth every 6 (six) hours as needed for mild pain.      Cetirizine HCl 10 MG CAPS Take 10 mg by mouth daily.       ciprofloxacin  (CIPRO ) 500 MG tablet Take 500 mg by mouth 2 (two) times daily. For 3 days      HYDROcodone -acetaminophen  (NORCO/VICODIN) 5-325 MG per tablet Take 1 tablet by mouth every 8 (eight) hours as needed for moderate pain.      lansoprazole  (PREVACID ) 15 MG capsule Take 1 capsule (15 mg total) by mouth daily at 12 noon. 14 capsule 1      Allergies  Allergen Reactions   Terfenadine Other (See Comments)    UNKNOWN     Past Medical History:  Diagnosis Date   Allergic rhinitis    Anxiety    regarding surgery   Asthma    Back pain    BPH with urinary obstruction    Diabetes mellitus without complication (HCC)    Esophageal reflux    GERD (gastroesophageal reflux disease)    Heartburn    Joint  pain    Polydipsia    Prostate cancer (HCC) 12/28/2013   Gleason 3+3=6, volume 33 gm   Pruritus    Sleep apnea     Review of systems:  Otherwise negative.    Physical Exam  Gen: Alert, oriented. Appears stated age.  HEENT: [place]/AT. PERRLA. Lungs: CTA, no wheezes. CV: RR nl S1, S2. Abd: soft, benign, no masses. BS+ Ext: No edema. Pulses 2+    Planned procedures: Proceed with colonoscopy. The patient understands the nature of the planned procedure, indications, risks, alternatives and potential complications including but not limited to bleeding, infection, perforation, damage to internal organs and possible oversedation/side effects from anesthesia. The patient agrees and gives consent to proceed.  Please refer to procedure notes for findings, recommendations and patient disposition/instructions.     Charles Buchanan, M.D. Gastroenterology 08/25/2024  10:24 AM

## 2024-08-25 NOTE — Anesthesia Postprocedure Evaluation (Signed)
 Anesthesia Post Note  Patient: Charles Buchanan  Procedure(s) Performed: COLONOSCOPY POLYPECTOMY, INTESTINE  Patient location during evaluation: Endoscopy Anesthesia Type: General Level of consciousness: awake and alert Pain management: pain level controlled Vital Signs Assessment: post-procedure vital signs reviewed and stable Respiratory status: spontaneous breathing, nonlabored ventilation, respiratory function stable and patient connected to nasal cannula oxygen Cardiovascular status: blood pressure returned to baseline and stable Postop Assessment: no apparent nausea or vomiting Anesthetic complications: no   No notable events documented.   Last Vitals:  Vitals:   08/25/24 1105 08/25/24 1115  BP: 101/69 107/77  Pulse: 60 (!) 58  Resp: 19 17  Temp:    SpO2: 97% 98%    Last Pain:  Vitals:   08/25/24 1056  TempSrc:   PainSc: 0-No pain                 Debby Mines

## 2024-08-25 NOTE — Interval H&P Note (Signed)
 History and Physical Interval Note:  08/25/2024 10:25 AM  Charles Buchanan  has presented today for surgery, with the diagnosis of HX OF ADENOMATOUS POLYP OF COLON.  The various methods of treatment have been discussed with the patient and family. After consideration of risks, benefits and other options for treatment, the patient has consented to  Procedure(s): COLONOSCOPY (N/A) as a surgical intervention.  The patient's history has been reviewed, patient examined, no change in status, stable for surgery.  I have reviewed the patient's chart and labs.  Questions were answered to the patient's satisfaction.     Rudolph, Ridhaan Dreibelbis

## 2024-08-25 NOTE — Anesthesia Preprocedure Evaluation (Signed)
 Anesthesia Evaluation  Patient identified by MRN, date of birth, ID band Patient awake    Reviewed: Allergy & Precautions, H&P , NPO status , Patient's Chart, lab work & pertinent test results  Airway Mallampati: III  TM Distance: >3 FB Neck ROM: Full    Dental  (+) Teeth Intact, Dental Advisory Given   Pulmonary asthma , sleep apnea and Continuous Positive Airway Pressure Ventilation    Pulmonary exam normal breath sounds clear to auscultation       Cardiovascular negative cardio ROS Normal cardiovascular exam Rhythm:Regular Rate:Normal     Neuro/Psych   Anxiety     negative neurological ROS  negative psych ROS   GI/Hepatic negative GI ROS, Neg liver ROS,GERD  Medicated,,  Endo/Other  negative endocrine ROSdiabetes    Renal/GU negative Renal ROS  negative genitourinary   Musculoskeletal   Abdominal   Peds  Hematology negative hematology ROS (+)   Anesthesia Other Findings Past Medical History: No date: Allergic rhinitis No date: Anxiety     Comment:  regarding surgery No date: Asthma No date: Back pain No date: BPH with urinary obstruction No date: Diabetes mellitus without complication (HCC) No date: Esophageal reflux No date: GERD (gastroesophageal reflux disease) No date: Heartburn No date: Joint pain No date: Polydipsia 12/28/2013: Prostate cancer (HCC)     Comment:  Gleason 3+3=6, volume 33 gm No date: Pruritus No date: Sleep apnea  Past Surgical History: 12/28/13: PROSTATE BIOPSY     Comment:  Gleason 6, vol 33 gm 02/23/2014: ROBOT ASSISTED LAPAROSCOPIC RADICAL PROSTATECTOMY; N/A     Comment:  Procedure: ROBOTIC ASSISTED LAPAROSCOPIC RADICAL               PROSTATECTOMY;  Surgeon: Alm GORMAN Fragmin, MD;  Location:               [facility] ORS;  Service: Urology;  Laterality: N/A; No date: TONSILLECTOMY No date: TOOTH EXTRACTION  BMI    Body Mass Index: 35.71 kg/m      Reproductive/Obstetrics negative  OB ROS                              Anesthesia Physical Anesthesia Plan  ASA: 3  Anesthesia Plan: General   Post-op Pain Management: Minimal or no pain anticipated   Induction: Intravenous  PONV Risk Score and Plan: 2 and Propofol  infusion and TIVA  Airway Management Planned: Nasal CPAP  Additional Equipment: None  Intra-op Plan:   Post-operative Plan:   Informed Consent: I have reviewed the patients History and Physical, chart, labs and discussed the procedure including the risks, benefits and alternatives for the proposed anesthesia with the patient or authorized representative who has indicated his/her understanding and acceptance.     Dental advisory given  Plan Discussed with: CRNA and Surgeon  Anesthesia Plan Comments: (Discussed risks of anesthesia with patient, including possibility of difficulty with spontaneous ventilation under anesthesia necessitating airway intervention, PONV, and rare risks such as cardiac or respiratory or neurological events, and allergic reactions. Discussed the role of CRNA in patient's perioperative care. Patient understands.)        Anesthesia Quick Evaluation
